# Patient Record
Sex: Male | Born: 1935
Health system: Southern US, Community
[De-identification: ages and names within clinical notes are randomized; demographics above are authoritative.]

## PROBLEM LIST (undated history)

## (undated) DIAGNOSIS — I214 Non-ST elevation (NSTEMI) myocardial infarction: Secondary | ICD-10-CM

## (undated) DIAGNOSIS — I429 Cardiomyopathy, unspecified: Secondary | ICD-10-CM

## (undated) DIAGNOSIS — C61 Malignant neoplasm of prostate: Secondary | ICD-10-CM

## (undated) DIAGNOSIS — I350 Nonrheumatic aortic (valve) stenosis: Secondary | ICD-10-CM

## (undated) DIAGNOSIS — I313 Pericardial effusion (noninflammatory): Secondary | ICD-10-CM

## (undated) DIAGNOSIS — H9190 Unspecified hearing loss, unspecified ear: Secondary | ICD-10-CM

## (undated) DIAGNOSIS — I3139 Other pericardial effusion (noninflammatory): Secondary | ICD-10-CM

---

## 2007-05-09 ENCOUNTER — Ambulatory Visit: Admission: RE | Admit: 2007-05-09 | Discharge: 2007-08-01 | Payer: Self-pay | Admitting: Radiation Oncology

## 2008-03-20 ENCOUNTER — Ambulatory Visit: Payer: Self-pay | Admitting: Cardiology

## 2011-11-25 DIAGNOSIS — E119 Type 2 diabetes mellitus without complications: Secondary | ICD-10-CM | POA: Diagnosis not present

## 2011-11-25 DIAGNOSIS — E1149 Type 2 diabetes mellitus with other diabetic neurological complication: Secondary | ICD-10-CM | POA: Diagnosis not present

## 2011-12-01 DIAGNOSIS — K3184 Gastroparesis: Secondary | ICD-10-CM | POA: Diagnosis not present

## 2011-12-01 DIAGNOSIS — R109 Unspecified abdominal pain: Secondary | ICD-10-CM | POA: Diagnosis not present

## 2011-12-09 DIAGNOSIS — N400 Enlarged prostate without lower urinary tract symptoms: Secondary | ICD-10-CM | POA: Diagnosis not present

## 2011-12-09 DIAGNOSIS — E782 Mixed hyperlipidemia: Secondary | ICD-10-CM | POA: Diagnosis not present

## 2011-12-09 DIAGNOSIS — E119 Type 2 diabetes mellitus without complications: Secondary | ICD-10-CM | POA: Diagnosis not present

## 2011-12-16 DIAGNOSIS — F411 Generalized anxiety disorder: Secondary | ICD-10-CM | POA: Diagnosis not present

## 2011-12-16 DIAGNOSIS — G252 Other specified forms of tremor: Secondary | ICD-10-CM | POA: Diagnosis not present

## 2011-12-16 DIAGNOSIS — E119 Type 2 diabetes mellitus without complications: Secondary | ICD-10-CM | POA: Diagnosis not present

## 2011-12-16 DIAGNOSIS — R1013 Epigastric pain: Secondary | ICD-10-CM | POA: Diagnosis not present

## 2011-12-16 DIAGNOSIS — M1990 Unspecified osteoarthritis, unspecified site: Secondary | ICD-10-CM | POA: Diagnosis not present

## 2012-02-03 DIAGNOSIS — E1149 Type 2 diabetes mellitus with other diabetic neurological complication: Secondary | ICD-10-CM | POA: Diagnosis not present

## 2012-02-03 DIAGNOSIS — E119 Type 2 diabetes mellitus without complications: Secondary | ICD-10-CM | POA: Diagnosis not present

## 2012-02-09 DIAGNOSIS — H6120 Impacted cerumen, unspecified ear: Secondary | ICD-10-CM | POA: Diagnosis not present

## 2012-02-09 DIAGNOSIS — H905 Unspecified sensorineural hearing loss: Secondary | ICD-10-CM | POA: Diagnosis not present

## 2012-02-09 DIAGNOSIS — H9110 Presbycusis, unspecified ear: Secondary | ICD-10-CM | POA: Diagnosis not present

## 2012-02-18 DIAGNOSIS — E782 Mixed hyperlipidemia: Secondary | ICD-10-CM | POA: Diagnosis not present

## 2012-02-18 DIAGNOSIS — Z79899 Other long term (current) drug therapy: Secondary | ICD-10-CM | POA: Diagnosis not present

## 2012-02-18 DIAGNOSIS — E119 Type 2 diabetes mellitus without complications: Secondary | ICD-10-CM | POA: Diagnosis not present

## 2012-02-23 DIAGNOSIS — G250 Essential tremor: Secondary | ICD-10-CM | POA: Diagnosis not present

## 2012-02-23 DIAGNOSIS — E119 Type 2 diabetes mellitus without complications: Secondary | ICD-10-CM | POA: Diagnosis not present

## 2012-02-23 DIAGNOSIS — M1990 Unspecified osteoarthritis, unspecified site: Secondary | ICD-10-CM | POA: Diagnosis not present

## 2012-02-23 DIAGNOSIS — R1013 Epigastric pain: Secondary | ICD-10-CM | POA: Diagnosis not present

## 2012-02-23 DIAGNOSIS — R5381 Other malaise: Secondary | ICD-10-CM | POA: Diagnosis not present

## 2012-02-23 DIAGNOSIS — F411 Generalized anxiety disorder: Secondary | ICD-10-CM | POA: Diagnosis not present

## 2012-02-23 DIAGNOSIS — G252 Other specified forms of tremor: Secondary | ICD-10-CM | POA: Diagnosis not present

## 2012-04-13 DIAGNOSIS — E1149 Type 2 diabetes mellitus with other diabetic neurological complication: Secondary | ICD-10-CM | POA: Diagnosis not present

## 2012-04-13 DIAGNOSIS — E119 Type 2 diabetes mellitus without complications: Secondary | ICD-10-CM | POA: Diagnosis not present

## 2012-05-06 DIAGNOSIS — H9209 Otalgia, unspecified ear: Secondary | ICD-10-CM | POA: Diagnosis not present

## 2012-06-15 DIAGNOSIS — C61 Malignant neoplasm of prostate: Secondary | ICD-10-CM | POA: Diagnosis not present

## 2012-06-16 DIAGNOSIS — E119 Type 2 diabetes mellitus without complications: Secondary | ICD-10-CM | POA: Diagnosis not present

## 2012-06-16 DIAGNOSIS — E782 Mixed hyperlipidemia: Secondary | ICD-10-CM | POA: Diagnosis not present

## 2012-06-24 DIAGNOSIS — G252 Other specified forms of tremor: Secondary | ICD-10-CM | POA: Diagnosis not present

## 2012-06-24 DIAGNOSIS — G250 Essential tremor: Secondary | ICD-10-CM | POA: Diagnosis not present

## 2012-06-24 DIAGNOSIS — R1013 Epigastric pain: Secondary | ICD-10-CM | POA: Diagnosis not present

## 2012-06-24 DIAGNOSIS — E119 Type 2 diabetes mellitus without complications: Secondary | ICD-10-CM | POA: Diagnosis not present

## 2012-06-24 DIAGNOSIS — M1990 Unspecified osteoarthritis, unspecified site: Secondary | ICD-10-CM | POA: Diagnosis not present

## 2012-06-24 DIAGNOSIS — K219 Gastro-esophageal reflux disease without esophagitis: Secondary | ICD-10-CM | POA: Diagnosis not present

## 2012-06-24 DIAGNOSIS — R5383 Other fatigue: Secondary | ICD-10-CM | POA: Diagnosis not present

## 2012-06-24 DIAGNOSIS — F411 Generalized anxiety disorder: Secondary | ICD-10-CM | POA: Diagnosis not present

## 2012-06-24 DIAGNOSIS — N400 Enlarged prostate without lower urinary tract symptoms: Secondary | ICD-10-CM | POA: Diagnosis not present

## 2012-06-29 DIAGNOSIS — E1149 Type 2 diabetes mellitus with other diabetic neurological complication: Secondary | ICD-10-CM | POA: Diagnosis not present

## 2012-06-29 DIAGNOSIS — E119 Type 2 diabetes mellitus without complications: Secondary | ICD-10-CM | POA: Diagnosis not present

## 2012-07-13 DIAGNOSIS — C61 Malignant neoplasm of prostate: Secondary | ICD-10-CM | POA: Diagnosis not present

## 2012-07-26 DIAGNOSIS — J069 Acute upper respiratory infection, unspecified: Secondary | ICD-10-CM | POA: Diagnosis not present

## 2012-07-26 DIAGNOSIS — J029 Acute pharyngitis, unspecified: Secondary | ICD-10-CM | POA: Diagnosis not present

## 2012-07-28 DIAGNOSIS — Z23 Encounter for immunization: Secondary | ICD-10-CM | POA: Diagnosis not present

## 2012-08-04 DIAGNOSIS — C61 Malignant neoplasm of prostate: Secondary | ICD-10-CM | POA: Diagnosis not present

## 2012-08-18 DIAGNOSIS — H402290 Chronic angle-closure glaucoma, unspecified eye, stage unspecified: Secondary | ICD-10-CM | POA: Diagnosis not present

## 2012-08-18 DIAGNOSIS — R350 Frequency of micturition: Secondary | ICD-10-CM | POA: Diagnosis not present

## 2012-08-25 DIAGNOSIS — H6120 Impacted cerumen, unspecified ear: Secondary | ICD-10-CM | POA: Diagnosis not present

## 2012-08-25 DIAGNOSIS — J069 Acute upper respiratory infection, unspecified: Secondary | ICD-10-CM | POA: Diagnosis not present

## 2012-08-25 DIAGNOSIS — H905 Unspecified sensorineural hearing loss: Secondary | ICD-10-CM | POA: Diagnosis not present

## 2012-09-07 DIAGNOSIS — E1149 Type 2 diabetes mellitus with other diabetic neurological complication: Secondary | ICD-10-CM | POA: Diagnosis not present

## 2012-09-07 DIAGNOSIS — E119 Type 2 diabetes mellitus without complications: Secondary | ICD-10-CM | POA: Diagnosis not present

## 2012-09-08 DIAGNOSIS — R1013 Epigastric pain: Secondary | ICD-10-CM | POA: Diagnosis not present

## 2012-09-08 DIAGNOSIS — R5383 Other fatigue: Secondary | ICD-10-CM | POA: Diagnosis not present

## 2012-09-08 DIAGNOSIS — K219 Gastro-esophageal reflux disease without esophagitis: Secondary | ICD-10-CM | POA: Diagnosis not present

## 2012-09-08 DIAGNOSIS — R634 Abnormal weight loss: Secondary | ICD-10-CM | POA: Diagnosis not present

## 2012-09-08 DIAGNOSIS — E119 Type 2 diabetes mellitus without complications: Secondary | ICD-10-CM | POA: Diagnosis not present

## 2012-09-08 DIAGNOSIS — M1990 Unspecified osteoarthritis, unspecified site: Secondary | ICD-10-CM | POA: Diagnosis not present

## 2012-09-08 DIAGNOSIS — F411 Generalized anxiety disorder: Secondary | ICD-10-CM | POA: Diagnosis not present

## 2012-09-08 DIAGNOSIS — G250 Essential tremor: Secondary | ICD-10-CM | POA: Diagnosis not present

## 2012-09-08 DIAGNOSIS — R5381 Other malaise: Secondary | ICD-10-CM | POA: Diagnosis not present

## 2012-10-03 DIAGNOSIS — R3915 Urgency of urination: Secondary | ICD-10-CM | POA: Diagnosis not present

## 2012-10-17 DIAGNOSIS — R5383 Other fatigue: Secondary | ICD-10-CM | POA: Diagnosis not present

## 2012-10-17 DIAGNOSIS — K219 Gastro-esophageal reflux disease without esophagitis: Secondary | ICD-10-CM | POA: Diagnosis not present

## 2012-10-17 DIAGNOSIS — N400 Enlarged prostate without lower urinary tract symptoms: Secondary | ICD-10-CM | POA: Diagnosis not present

## 2012-10-17 DIAGNOSIS — G250 Essential tremor: Secondary | ICD-10-CM | POA: Diagnosis not present

## 2012-10-17 DIAGNOSIS — E7800 Pure hypercholesterolemia, unspecified: Secondary | ICD-10-CM | POA: Diagnosis not present

## 2012-10-17 DIAGNOSIS — E119 Type 2 diabetes mellitus without complications: Secondary | ICD-10-CM | POA: Diagnosis not present

## 2012-10-17 DIAGNOSIS — Z79899 Other long term (current) drug therapy: Secondary | ICD-10-CM | POA: Diagnosis not present

## 2012-10-17 DIAGNOSIS — R5381 Other malaise: Secondary | ICD-10-CM | POA: Diagnosis not present

## 2012-10-17 DIAGNOSIS — F411 Generalized anxiety disorder: Secondary | ICD-10-CM | POA: Diagnosis not present

## 2012-10-17 DIAGNOSIS — M1990 Unspecified osteoarthritis, unspecified site: Secondary | ICD-10-CM | POA: Diagnosis not present

## 2012-10-17 DIAGNOSIS — R1013 Epigastric pain: Secondary | ICD-10-CM | POA: Diagnosis not present

## 2012-10-17 DIAGNOSIS — G252 Other specified forms of tremor: Secondary | ICD-10-CM | POA: Diagnosis not present

## 2012-10-19 DIAGNOSIS — R3915 Urgency of urination: Secondary | ICD-10-CM | POA: Diagnosis not present

## 2012-10-26 DIAGNOSIS — R351 Nocturia: Secondary | ICD-10-CM | POA: Diagnosis not present

## 2012-10-26 DIAGNOSIS — K5900 Constipation, unspecified: Secondary | ICD-10-CM | POA: Diagnosis not present

## 2012-10-26 DIAGNOSIS — N3943 Post-void dribbling: Secondary | ICD-10-CM | POA: Diagnosis not present

## 2012-10-26 DIAGNOSIS — R3915 Urgency of urination: Secondary | ICD-10-CM | POA: Diagnosis not present

## 2012-11-23 DIAGNOSIS — E119 Type 2 diabetes mellitus without complications: Secondary | ICD-10-CM | POA: Diagnosis not present

## 2012-11-23 DIAGNOSIS — E1149 Type 2 diabetes mellitus with other diabetic neurological complication: Secondary | ICD-10-CM | POA: Diagnosis not present

## 2012-11-25 DIAGNOSIS — R3915 Urgency of urination: Secondary | ICD-10-CM | POA: Diagnosis not present

## 2012-11-25 DIAGNOSIS — K5900 Constipation, unspecified: Secondary | ICD-10-CM | POA: Diagnosis not present

## 2012-11-25 DIAGNOSIS — R351 Nocturia: Secondary | ICD-10-CM | POA: Diagnosis not present

## 2012-11-25 DIAGNOSIS — N3943 Post-void dribbling: Secondary | ICD-10-CM | POA: Diagnosis not present

## 2012-12-22 DIAGNOSIS — E538 Deficiency of other specified B group vitamins: Secondary | ICD-10-CM | POA: Diagnosis not present

## 2012-12-22 DIAGNOSIS — R209 Unspecified disturbances of skin sensation: Secondary | ICD-10-CM | POA: Diagnosis not present

## 2012-12-22 DIAGNOSIS — E119 Type 2 diabetes mellitus without complications: Secondary | ICD-10-CM | POA: Diagnosis not present

## 2013-01-31 DIAGNOSIS — E119 Type 2 diabetes mellitus without complications: Secondary | ICD-10-CM | POA: Diagnosis not present

## 2013-01-31 DIAGNOSIS — E782 Mixed hyperlipidemia: Secondary | ICD-10-CM | POA: Diagnosis not present

## 2013-02-01 DIAGNOSIS — E119 Type 2 diabetes mellitus without complications: Secondary | ICD-10-CM | POA: Diagnosis not present

## 2013-02-01 DIAGNOSIS — E1149 Type 2 diabetes mellitus with other diabetic neurological complication: Secondary | ICD-10-CM | POA: Diagnosis not present

## 2013-02-07 DIAGNOSIS — N400 Enlarged prostate without lower urinary tract symptoms: Secondary | ICD-10-CM | POA: Diagnosis not present

## 2013-02-07 DIAGNOSIS — R209 Unspecified disturbances of skin sensation: Secondary | ICD-10-CM | POA: Diagnosis not present

## 2013-02-07 DIAGNOSIS — E119 Type 2 diabetes mellitus without complications: Secondary | ICD-10-CM | POA: Diagnosis not present

## 2013-02-07 DIAGNOSIS — M1990 Unspecified osteoarthritis, unspecified site: Secondary | ICD-10-CM | POA: Diagnosis not present

## 2013-02-07 DIAGNOSIS — E782 Mixed hyperlipidemia: Secondary | ICD-10-CM | POA: Diagnosis not present

## 2013-02-21 DIAGNOSIS — J309 Allergic rhinitis, unspecified: Secondary | ICD-10-CM | POA: Diagnosis not present

## 2013-02-21 DIAGNOSIS — R209 Unspecified disturbances of skin sensation: Secondary | ICD-10-CM | POA: Diagnosis not present

## 2013-03-02 DIAGNOSIS — R5381 Other malaise: Secondary | ICD-10-CM | POA: Diagnosis not present

## 2013-03-02 DIAGNOSIS — K219 Gastro-esophageal reflux disease without esophagitis: Secondary | ICD-10-CM | POA: Diagnosis not present

## 2013-03-02 DIAGNOSIS — R209 Unspecified disturbances of skin sensation: Secondary | ICD-10-CM | POA: Diagnosis not present

## 2013-03-06 DIAGNOSIS — M1990 Unspecified osteoarthritis, unspecified site: Secondary | ICD-10-CM | POA: Diagnosis not present

## 2013-03-06 DIAGNOSIS — R209 Unspecified disturbances of skin sensation: Secondary | ICD-10-CM | POA: Diagnosis not present

## 2013-03-06 DIAGNOSIS — E119 Type 2 diabetes mellitus without complications: Secondary | ICD-10-CM | POA: Diagnosis not present

## 2013-03-06 DIAGNOSIS — N400 Enlarged prostate without lower urinary tract symptoms: Secondary | ICD-10-CM | POA: Diagnosis not present

## 2013-03-06 DIAGNOSIS — E782 Mixed hyperlipidemia: Secondary | ICD-10-CM | POA: Diagnosis not present

## 2013-03-13 DIAGNOSIS — H9319 Tinnitus, unspecified ear: Secondary | ICD-10-CM | POA: Diagnosis not present

## 2013-03-13 DIAGNOSIS — H6120 Impacted cerumen, unspecified ear: Secondary | ICD-10-CM | POA: Diagnosis not present

## 2013-03-24 DIAGNOSIS — C61 Malignant neoplasm of prostate: Secondary | ICD-10-CM | POA: Diagnosis not present

## 2013-03-24 DIAGNOSIS — K5900 Constipation, unspecified: Secondary | ICD-10-CM | POA: Diagnosis not present

## 2013-03-24 DIAGNOSIS — R3915 Urgency of urination: Secondary | ICD-10-CM | POA: Diagnosis not present

## 2013-03-24 DIAGNOSIS — N35919 Unspecified urethral stricture, male, unspecified site: Secondary | ICD-10-CM | POA: Diagnosis not present

## 2013-04-12 DIAGNOSIS — E119 Type 2 diabetes mellitus without complications: Secondary | ICD-10-CM | POA: Diagnosis not present

## 2013-04-12 DIAGNOSIS — E1149 Type 2 diabetes mellitus with other diabetic neurological complication: Secondary | ICD-10-CM | POA: Diagnosis not present

## 2013-04-26 DIAGNOSIS — H4011X0 Primary open-angle glaucoma, stage unspecified: Secondary | ICD-10-CM | POA: Diagnosis not present

## 2013-05-15 DIAGNOSIS — I10 Essential (primary) hypertension: Secondary | ICD-10-CM | POA: Diagnosis not present

## 2013-05-15 DIAGNOSIS — N400 Enlarged prostate without lower urinary tract symptoms: Secondary | ICD-10-CM | POA: Diagnosis not present

## 2013-05-15 DIAGNOSIS — E119 Type 2 diabetes mellitus without complications: Secondary | ICD-10-CM | POA: Diagnosis not present

## 2013-05-15 DIAGNOSIS — R5383 Other fatigue: Secondary | ICD-10-CM | POA: Diagnosis not present

## 2013-05-22 DIAGNOSIS — E119 Type 2 diabetes mellitus without complications: Secondary | ICD-10-CM | POA: Diagnosis not present

## 2013-05-22 DIAGNOSIS — R209 Unspecified disturbances of skin sensation: Secondary | ICD-10-CM | POA: Diagnosis not present

## 2013-05-22 DIAGNOSIS — E782 Mixed hyperlipidemia: Secondary | ICD-10-CM | POA: Diagnosis not present

## 2013-05-22 DIAGNOSIS — M1990 Unspecified osteoarthritis, unspecified site: Secondary | ICD-10-CM | POA: Diagnosis not present

## 2013-05-22 DIAGNOSIS — N400 Enlarged prostate without lower urinary tract symptoms: Secondary | ICD-10-CM | POA: Diagnosis not present

## 2013-06-21 DIAGNOSIS — E119 Type 2 diabetes mellitus without complications: Secondary | ICD-10-CM | POA: Diagnosis not present

## 2013-06-21 DIAGNOSIS — E1149 Type 2 diabetes mellitus with other diabetic neurological complication: Secondary | ICD-10-CM | POA: Diagnosis not present

## 2013-07-27 DIAGNOSIS — Z23 Encounter for immunization: Secondary | ICD-10-CM | POA: Diagnosis not present

## 2013-08-30 DIAGNOSIS — E119 Type 2 diabetes mellitus without complications: Secondary | ICD-10-CM | POA: Diagnosis not present

## 2013-08-30 DIAGNOSIS — E1149 Type 2 diabetes mellitus with other diabetic neurological complication: Secondary | ICD-10-CM | POA: Diagnosis not present

## 2013-09-13 DIAGNOSIS — I10 Essential (primary) hypertension: Secondary | ICD-10-CM | POA: Diagnosis not present

## 2013-09-13 DIAGNOSIS — E782 Mixed hyperlipidemia: Secondary | ICD-10-CM | POA: Diagnosis not present

## 2013-09-15 DIAGNOSIS — H905 Unspecified sensorineural hearing loss: Secondary | ICD-10-CM | POA: Diagnosis not present

## 2013-09-15 DIAGNOSIS — G4700 Insomnia, unspecified: Secondary | ICD-10-CM | POA: Diagnosis not present

## 2013-09-15 DIAGNOSIS — H6120 Impacted cerumen, unspecified ear: Secondary | ICD-10-CM | POA: Diagnosis not present

## 2013-09-15 DIAGNOSIS — H9319 Tinnitus, unspecified ear: Secondary | ICD-10-CM | POA: Diagnosis not present

## 2013-09-20 DIAGNOSIS — E782 Mixed hyperlipidemia: Secondary | ICD-10-CM | POA: Diagnosis not present

## 2013-09-20 DIAGNOSIS — R209 Unspecified disturbances of skin sensation: Secondary | ICD-10-CM | POA: Diagnosis not present

## 2013-09-20 DIAGNOSIS — E1149 Type 2 diabetes mellitus with other diabetic neurological complication: Secondary | ICD-10-CM | POA: Diagnosis not present

## 2013-09-20 DIAGNOSIS — M1990 Unspecified osteoarthritis, unspecified site: Secondary | ICD-10-CM | POA: Diagnosis not present

## 2013-09-20 DIAGNOSIS — N400 Enlarged prostate without lower urinary tract symptoms: Secondary | ICD-10-CM | POA: Diagnosis not present

## 2013-09-20 DIAGNOSIS — Z1331 Encounter for screening for depression: Secondary | ICD-10-CM | POA: Diagnosis not present

## 2013-09-20 DIAGNOSIS — E119 Type 2 diabetes mellitus without complications: Secondary | ICD-10-CM | POA: Diagnosis not present

## 2013-09-25 DIAGNOSIS — N35919 Unspecified urethral stricture, male, unspecified site: Secondary | ICD-10-CM | POA: Diagnosis not present

## 2013-09-25 DIAGNOSIS — R3915 Urgency of urination: Secondary | ICD-10-CM | POA: Diagnosis not present

## 2013-09-25 DIAGNOSIS — C61 Malignant neoplasm of prostate: Secondary | ICD-10-CM | POA: Diagnosis not present

## 2013-09-25 DIAGNOSIS — K5900 Constipation, unspecified: Secondary | ICD-10-CM | POA: Diagnosis not present

## 2013-10-24 DIAGNOSIS — R3915 Urgency of urination: Secondary | ICD-10-CM | POA: Diagnosis not present

## 2013-10-24 DIAGNOSIS — C61 Malignant neoplasm of prostate: Secondary | ICD-10-CM | POA: Diagnosis not present

## 2013-10-24 DIAGNOSIS — N35919 Unspecified urethral stricture, male, unspecified site: Secondary | ICD-10-CM | POA: Diagnosis not present

## 2013-10-24 DIAGNOSIS — K5900 Constipation, unspecified: Secondary | ICD-10-CM | POA: Diagnosis not present

## 2013-11-01 DIAGNOSIS — E1149 Type 2 diabetes mellitus with other diabetic neurological complication: Secondary | ICD-10-CM | POA: Diagnosis not present

## 2013-11-01 DIAGNOSIS — E119 Type 2 diabetes mellitus without complications: Secondary | ICD-10-CM | POA: Diagnosis not present

## 2013-11-13 DIAGNOSIS — H60399 Other infective otitis externa, unspecified ear: Secondary | ICD-10-CM | POA: Diagnosis not present

## 2013-11-13 DIAGNOSIS — H905 Unspecified sensorineural hearing loss: Secondary | ICD-10-CM | POA: Diagnosis not present

## 2013-11-24 DIAGNOSIS — R3915 Urgency of urination: Secondary | ICD-10-CM | POA: Diagnosis not present

## 2013-11-24 DIAGNOSIS — K5900 Constipation, unspecified: Secondary | ICD-10-CM | POA: Diagnosis not present

## 2013-11-24 DIAGNOSIS — C61 Malignant neoplasm of prostate: Secondary | ICD-10-CM | POA: Diagnosis not present

## 2013-12-11 DIAGNOSIS — E119 Type 2 diabetes mellitus without complications: Secondary | ICD-10-CM | POA: Diagnosis not present

## 2013-12-25 DIAGNOSIS — H6120 Impacted cerumen, unspecified ear: Secondary | ICD-10-CM | POA: Diagnosis not present

## 2013-12-27 DIAGNOSIS — R5383 Other fatigue: Secondary | ICD-10-CM | POA: Diagnosis not present

## 2013-12-27 DIAGNOSIS — M1990 Unspecified osteoarthritis, unspecified site: Secondary | ICD-10-CM | POA: Diagnosis not present

## 2013-12-27 DIAGNOSIS — R5381 Other malaise: Secondary | ICD-10-CM | POA: Diagnosis not present

## 2013-12-27 DIAGNOSIS — E782 Mixed hyperlipidemia: Secondary | ICD-10-CM | POA: Diagnosis not present

## 2013-12-27 DIAGNOSIS — E119 Type 2 diabetes mellitus without complications: Secondary | ICD-10-CM | POA: Diagnosis not present

## 2013-12-27 DIAGNOSIS — K2100 Gastro-esophageal reflux disease with esophagitis, without bleeding: Secondary | ICD-10-CM | POA: Diagnosis not present

## 2013-12-27 DIAGNOSIS — I10 Essential (primary) hypertension: Secondary | ICD-10-CM | POA: Diagnosis not present

## 2014-01-03 DIAGNOSIS — K2100 Gastro-esophageal reflux disease with esophagitis, without bleeding: Secondary | ICD-10-CM | POA: Diagnosis not present

## 2014-01-03 DIAGNOSIS — M1990 Unspecified osteoarthritis, unspecified site: Secondary | ICD-10-CM | POA: Diagnosis not present

## 2014-01-03 DIAGNOSIS — E782 Mixed hyperlipidemia: Secondary | ICD-10-CM | POA: Diagnosis not present

## 2014-01-03 DIAGNOSIS — E1149 Type 2 diabetes mellitus with other diabetic neurological complication: Secondary | ICD-10-CM | POA: Diagnosis not present

## 2014-01-03 DIAGNOSIS — R209 Unspecified disturbances of skin sensation: Secondary | ICD-10-CM | POA: Diagnosis not present

## 2014-01-03 DIAGNOSIS — N400 Enlarged prostate without lower urinary tract symptoms: Secondary | ICD-10-CM | POA: Diagnosis not present

## 2014-01-03 DIAGNOSIS — Z1331 Encounter for screening for depression: Secondary | ICD-10-CM | POA: Diagnosis not present

## 2014-01-03 DIAGNOSIS — E119 Type 2 diabetes mellitus without complications: Secondary | ICD-10-CM | POA: Diagnosis not present

## 2014-01-17 DIAGNOSIS — E1149 Type 2 diabetes mellitus with other diabetic neurological complication: Secondary | ICD-10-CM | POA: Diagnosis not present

## 2014-01-17 DIAGNOSIS — E119 Type 2 diabetes mellitus without complications: Secondary | ICD-10-CM | POA: Diagnosis not present

## 2014-03-21 DIAGNOSIS — H905 Unspecified sensorineural hearing loss: Secondary | ICD-10-CM | POA: Diagnosis not present

## 2014-03-21 DIAGNOSIS — H6120 Impacted cerumen, unspecified ear: Secondary | ICD-10-CM | POA: Diagnosis not present

## 2014-03-21 DIAGNOSIS — H9209 Otalgia, unspecified ear: Secondary | ICD-10-CM | POA: Diagnosis not present

## 2014-03-28 DIAGNOSIS — E1149 Type 2 diabetes mellitus with other diabetic neurological complication: Secondary | ICD-10-CM | POA: Diagnosis not present

## 2014-03-28 DIAGNOSIS — E119 Type 2 diabetes mellitus without complications: Secondary | ICD-10-CM | POA: Diagnosis not present

## 2014-05-16 DIAGNOSIS — E782 Mixed hyperlipidemia: Secondary | ICD-10-CM | POA: Diagnosis not present

## 2014-05-16 DIAGNOSIS — I10 Essential (primary) hypertension: Secondary | ICD-10-CM | POA: Diagnosis not present

## 2014-05-16 DIAGNOSIS — R5381 Other malaise: Secondary | ICD-10-CM | POA: Diagnosis not present

## 2014-05-16 DIAGNOSIS — IMO0001 Reserved for inherently not codable concepts without codable children: Secondary | ICD-10-CM | POA: Diagnosis not present

## 2014-05-16 DIAGNOSIS — K219 Gastro-esophageal reflux disease without esophagitis: Secondary | ICD-10-CM | POA: Diagnosis not present

## 2014-05-23 DIAGNOSIS — M1990 Unspecified osteoarthritis, unspecified site: Secondary | ICD-10-CM | POA: Diagnosis not present

## 2014-05-23 DIAGNOSIS — N400 Enlarged prostate without lower urinary tract symptoms: Secondary | ICD-10-CM | POA: Diagnosis not present

## 2014-05-23 DIAGNOSIS — E782 Mixed hyperlipidemia: Secondary | ICD-10-CM | POA: Diagnosis not present

## 2014-05-23 DIAGNOSIS — K2100 Gastro-esophageal reflux disease with esophagitis, without bleeding: Secondary | ICD-10-CM | POA: Diagnosis not present

## 2014-05-23 DIAGNOSIS — E1149 Type 2 diabetes mellitus with other diabetic neurological complication: Secondary | ICD-10-CM | POA: Diagnosis not present

## 2014-05-23 DIAGNOSIS — E119 Type 2 diabetes mellitus without complications: Secondary | ICD-10-CM | POA: Diagnosis not present

## 2014-05-23 DIAGNOSIS — R209 Unspecified disturbances of skin sensation: Secondary | ICD-10-CM | POA: Diagnosis not present

## 2014-06-07 DIAGNOSIS — H9209 Otalgia, unspecified ear: Secondary | ICD-10-CM | POA: Diagnosis not present

## 2014-06-07 DIAGNOSIS — M542 Cervicalgia: Secondary | ICD-10-CM | POA: Diagnosis not present

## 2014-06-07 DIAGNOSIS — J310 Chronic rhinitis: Secondary | ICD-10-CM | POA: Diagnosis not present

## 2014-06-11 DIAGNOSIS — M542 Cervicalgia: Secondary | ICD-10-CM | POA: Diagnosis not present

## 2014-06-11 DIAGNOSIS — J019 Acute sinusitis, unspecified: Secondary | ICD-10-CM | POA: Diagnosis not present

## 2014-06-13 DIAGNOSIS — E119 Type 2 diabetes mellitus without complications: Secondary | ICD-10-CM | POA: Diagnosis not present

## 2014-06-13 DIAGNOSIS — E1149 Type 2 diabetes mellitus with other diabetic neurological complication: Secondary | ICD-10-CM | POA: Diagnosis not present

## 2014-06-18 DIAGNOSIS — H4011X0 Primary open-angle glaucoma, stage unspecified: Secondary | ICD-10-CM | POA: Diagnosis not present

## 2014-06-18 DIAGNOSIS — K5900 Constipation, unspecified: Secondary | ICD-10-CM | POA: Diagnosis not present

## 2014-06-18 DIAGNOSIS — R3915 Urgency of urination: Secondary | ICD-10-CM | POA: Diagnosis not present

## 2014-07-04 DIAGNOSIS — C61 Malignant neoplasm of prostate: Secondary | ICD-10-CM | POA: Diagnosis not present

## 2014-07-04 DIAGNOSIS — R972 Elevated prostate specific antigen [PSA]: Secondary | ICD-10-CM | POA: Diagnosis not present

## 2014-07-18 DIAGNOSIS — E1149 Type 2 diabetes mellitus with other diabetic neurological complication: Secondary | ICD-10-CM | POA: Diagnosis not present

## 2014-07-18 DIAGNOSIS — M1990 Unspecified osteoarthritis, unspecified site: Secondary | ICD-10-CM | POA: Diagnosis not present

## 2014-07-18 DIAGNOSIS — N400 Enlarged prostate without lower urinary tract symptoms: Secondary | ICD-10-CM | POA: Diagnosis not present

## 2014-07-18 DIAGNOSIS — E782 Mixed hyperlipidemia: Secondary | ICD-10-CM | POA: Diagnosis not present

## 2014-07-18 DIAGNOSIS — E119 Type 2 diabetes mellitus without complications: Secondary | ICD-10-CM | POA: Diagnosis not present

## 2014-07-18 DIAGNOSIS — Z125 Encounter for screening for malignant neoplasm of prostate: Secondary | ICD-10-CM | POA: Diagnosis not present

## 2014-07-18 DIAGNOSIS — R209 Unspecified disturbances of skin sensation: Secondary | ICD-10-CM | POA: Diagnosis not present

## 2014-07-18 DIAGNOSIS — K2100 Gastro-esophageal reflux disease with esophagitis, without bleeding: Secondary | ICD-10-CM | POA: Diagnosis not present

## 2014-08-06 DIAGNOSIS — Z23 Encounter for immunization: Secondary | ICD-10-CM | POA: Diagnosis not present

## 2014-08-21 DIAGNOSIS — E1142 Type 2 diabetes mellitus with diabetic polyneuropathy: Secondary | ICD-10-CM | POA: Diagnosis not present

## 2014-08-21 DIAGNOSIS — E1140 Type 2 diabetes mellitus with diabetic neuropathy, unspecified: Secondary | ICD-10-CM | POA: Diagnosis not present

## 2014-08-31 DIAGNOSIS — H903 Sensorineural hearing loss, bilateral: Secondary | ICD-10-CM | POA: Diagnosis not present

## 2014-08-31 DIAGNOSIS — R29818 Other symptoms and signs involving the nervous system: Secondary | ICD-10-CM | POA: Diagnosis not present

## 2014-08-31 DIAGNOSIS — R2689 Other abnormalities of gait and mobility: Secondary | ICD-10-CM | POA: Diagnosis not present

## 2014-08-31 DIAGNOSIS — E119 Type 2 diabetes mellitus without complications: Secondary | ICD-10-CM | POA: Diagnosis not present

## 2014-08-31 DIAGNOSIS — H6123 Impacted cerumen, bilateral: Secondary | ICD-10-CM | POA: Diagnosis not present

## 2014-09-12 DIAGNOSIS — E1165 Type 2 diabetes mellitus with hyperglycemia: Secondary | ICD-10-CM | POA: Diagnosis not present

## 2014-09-12 DIAGNOSIS — R5383 Other fatigue: Secondary | ICD-10-CM | POA: Diagnosis not present

## 2014-09-12 DIAGNOSIS — E782 Mixed hyperlipidemia: Secondary | ICD-10-CM | POA: Diagnosis not present

## 2014-09-12 DIAGNOSIS — I10 Essential (primary) hypertension: Secondary | ICD-10-CM | POA: Diagnosis not present

## 2014-09-19 DIAGNOSIS — H402231 Chronic angle-closure glaucoma, bilateral, mild stage: Secondary | ICD-10-CM | POA: Diagnosis not present

## 2014-09-19 DIAGNOSIS — Z9189 Other specified personal risk factors, not elsewhere classified: Secondary | ICD-10-CM | POA: Diagnosis not present

## 2014-09-19 DIAGNOSIS — C61 Malignant neoplasm of prostate: Secondary | ICD-10-CM | POA: Diagnosis not present

## 2014-09-19 DIAGNOSIS — F411 Generalized anxiety disorder: Secondary | ICD-10-CM | POA: Diagnosis not present

## 2014-09-19 DIAGNOSIS — E782 Mixed hyperlipidemia: Secondary | ICD-10-CM | POA: Diagnosis not present

## 2014-09-19 DIAGNOSIS — E1165 Type 2 diabetes mellitus with hyperglycemia: Secondary | ICD-10-CM | POA: Diagnosis not present

## 2014-09-19 DIAGNOSIS — I10 Essential (primary) hypertension: Secondary | ICD-10-CM | POA: Diagnosis not present

## 2014-09-19 DIAGNOSIS — D519 Vitamin B12 deficiency anemia, unspecified: Secondary | ICD-10-CM | POA: Diagnosis not present

## 2014-10-30 DIAGNOSIS — E1142 Type 2 diabetes mellitus with diabetic polyneuropathy: Secondary | ICD-10-CM | POA: Diagnosis not present

## 2014-11-26 DIAGNOSIS — C61 Malignant neoplasm of prostate: Secondary | ICD-10-CM | POA: Diagnosis not present

## 2014-12-03 DIAGNOSIS — N359 Urethral stricture, unspecified: Secondary | ICD-10-CM | POA: Diagnosis not present

## 2014-12-03 DIAGNOSIS — C61 Malignant neoplasm of prostate: Secondary | ICD-10-CM | POA: Diagnosis not present

## 2014-12-27 DIAGNOSIS — H6123 Impacted cerumen, bilateral: Secondary | ICD-10-CM | POA: Diagnosis not present

## 2014-12-27 DIAGNOSIS — H9203 Otalgia, bilateral: Secondary | ICD-10-CM | POA: Diagnosis not present

## 2014-12-27 DIAGNOSIS — E119 Type 2 diabetes mellitus without complications: Secondary | ICD-10-CM | POA: Diagnosis not present

## 2014-12-27 DIAGNOSIS — H906 Mixed conductive and sensorineural hearing loss, bilateral: Secondary | ICD-10-CM | POA: Diagnosis not present

## 2015-01-01 DIAGNOSIS — N35112 Postinfective bulbous urethral stricture, not elsewhere classified: Secondary | ICD-10-CM | POA: Diagnosis not present

## 2015-01-15 DIAGNOSIS — E1142 Type 2 diabetes mellitus with diabetic polyneuropathy: Secondary | ICD-10-CM | POA: Diagnosis not present

## 2015-01-29 DIAGNOSIS — C61 Malignant neoplasm of prostate: Secondary | ICD-10-CM | POA: Diagnosis not present

## 2015-01-29 DIAGNOSIS — F411 Generalized anxiety disorder: Secondary | ICD-10-CM | POA: Diagnosis not present

## 2015-01-29 DIAGNOSIS — E1165 Type 2 diabetes mellitus with hyperglycemia: Secondary | ICD-10-CM | POA: Diagnosis not present

## 2015-01-29 DIAGNOSIS — K210 Gastro-esophageal reflux disease with esophagitis: Secondary | ICD-10-CM | POA: Diagnosis not present

## 2015-01-29 DIAGNOSIS — E782 Mixed hyperlipidemia: Secondary | ICD-10-CM | POA: Diagnosis not present

## 2015-01-29 DIAGNOSIS — I10 Essential (primary) hypertension: Secondary | ICD-10-CM | POA: Diagnosis not present

## 2015-02-05 DIAGNOSIS — H402231 Chronic angle-closure glaucoma, bilateral, mild stage: Secondary | ICD-10-CM | POA: Diagnosis not present

## 2015-02-05 DIAGNOSIS — I10 Essential (primary) hypertension: Secondary | ICD-10-CM | POA: Diagnosis not present

## 2015-02-05 DIAGNOSIS — C61 Malignant neoplasm of prostate: Secondary | ICD-10-CM | POA: Diagnosis not present

## 2015-02-05 DIAGNOSIS — E782 Mixed hyperlipidemia: Secondary | ICD-10-CM | POA: Diagnosis not present

## 2015-02-05 DIAGNOSIS — K219 Gastro-esophageal reflux disease without esophagitis: Secondary | ICD-10-CM | POA: Diagnosis not present

## 2015-02-05 DIAGNOSIS — Z0001 Encounter for general adult medical examination with abnormal findings: Secondary | ICD-10-CM | POA: Diagnosis not present

## 2015-02-05 DIAGNOSIS — Z9189 Other specified personal risk factors, not elsewhere classified: Secondary | ICD-10-CM | POA: Diagnosis not present

## 2015-02-05 DIAGNOSIS — E1165 Type 2 diabetes mellitus with hyperglycemia: Secondary | ICD-10-CM | POA: Diagnosis not present

## 2015-02-05 DIAGNOSIS — D519 Vitamin B12 deficiency anemia, unspecified: Secondary | ICD-10-CM | POA: Diagnosis not present

## 2015-02-05 DIAGNOSIS — Z1389 Encounter for screening for other disorder: Secondary | ICD-10-CM | POA: Diagnosis not present

## 2015-02-05 DIAGNOSIS — H903 Sensorineural hearing loss, bilateral: Secondary | ICD-10-CM | POA: Diagnosis not present

## 2015-02-05 DIAGNOSIS — F411 Generalized anxiety disorder: Secondary | ICD-10-CM | POA: Diagnosis not present

## 2015-02-18 DIAGNOSIS — H4011X1 Primary open-angle glaucoma, mild stage: Secondary | ICD-10-CM | POA: Diagnosis not present

## 2015-03-26 DIAGNOSIS — K648 Other hemorrhoids: Secondary | ICD-10-CM | POA: Diagnosis not present

## 2015-04-02 DIAGNOSIS — E1142 Type 2 diabetes mellitus with diabetic polyneuropathy: Secondary | ICD-10-CM | POA: Diagnosis not present

## 2015-04-08 DIAGNOSIS — J301 Allergic rhinitis due to pollen: Secondary | ICD-10-CM | POA: Diagnosis not present

## 2015-04-08 DIAGNOSIS — H9202 Otalgia, left ear: Secondary | ICD-10-CM | POA: Diagnosis not present

## 2015-04-08 DIAGNOSIS — K648 Other hemorrhoids: Secondary | ICD-10-CM | POA: Diagnosis not present

## 2015-06-06 DIAGNOSIS — K210 Gastro-esophageal reflux disease with esophagitis: Secondary | ICD-10-CM | POA: Diagnosis not present

## 2015-06-06 DIAGNOSIS — I10 Essential (primary) hypertension: Secondary | ICD-10-CM | POA: Diagnosis not present

## 2015-06-06 DIAGNOSIS — C61 Malignant neoplasm of prostate: Secondary | ICD-10-CM | POA: Diagnosis not present

## 2015-06-06 DIAGNOSIS — E1165 Type 2 diabetes mellitus with hyperglycemia: Secondary | ICD-10-CM | POA: Diagnosis not present

## 2015-06-06 DIAGNOSIS — E782 Mixed hyperlipidemia: Secondary | ICD-10-CM | POA: Diagnosis not present

## 2015-06-10 DIAGNOSIS — K040 Pulpitis: Secondary | ICD-10-CM | POA: Diagnosis not present

## 2015-06-11 DIAGNOSIS — E1142 Type 2 diabetes mellitus with diabetic polyneuropathy: Secondary | ICD-10-CM | POA: Diagnosis not present

## 2015-06-14 DIAGNOSIS — I10 Essential (primary) hypertension: Secondary | ICD-10-CM | POA: Diagnosis not present

## 2015-06-14 DIAGNOSIS — J301 Allergic rhinitis due to pollen: Secondary | ICD-10-CM | POA: Diagnosis not present

## 2015-06-14 DIAGNOSIS — K219 Gastro-esophageal reflux disease without esophagitis: Secondary | ICD-10-CM | POA: Diagnosis not present

## 2015-06-14 DIAGNOSIS — E1165 Type 2 diabetes mellitus with hyperglycemia: Secondary | ICD-10-CM | POA: Diagnosis not present

## 2015-06-14 DIAGNOSIS — C61 Malignant neoplasm of prostate: Secondary | ICD-10-CM | POA: Diagnosis not present

## 2015-06-14 DIAGNOSIS — F411 Generalized anxiety disorder: Secondary | ICD-10-CM | POA: Diagnosis not present

## 2015-06-14 DIAGNOSIS — H903 Sensorineural hearing loss, bilateral: Secondary | ICD-10-CM | POA: Diagnosis not present

## 2015-07-04 DIAGNOSIS — H906 Mixed conductive and sensorineural hearing loss, bilateral: Secondary | ICD-10-CM | POA: Diagnosis not present

## 2015-07-04 DIAGNOSIS — H6123 Impacted cerumen, bilateral: Secondary | ICD-10-CM | POA: Diagnosis not present

## 2015-08-20 DIAGNOSIS — H401131 Primary open-angle glaucoma, bilateral, mild stage: Secondary | ICD-10-CM | POA: Diagnosis not present

## 2015-08-29 DIAGNOSIS — Z23 Encounter for immunization: Secondary | ICD-10-CM | POA: Diagnosis not present

## 2015-09-03 DIAGNOSIS — E1142 Type 2 diabetes mellitus with diabetic polyneuropathy: Secondary | ICD-10-CM | POA: Diagnosis not present

## 2015-10-15 DIAGNOSIS — R5383 Other fatigue: Secondary | ICD-10-CM | POA: Diagnosis not present

## 2015-10-15 DIAGNOSIS — K210 Gastro-esophageal reflux disease with esophagitis: Secondary | ICD-10-CM | POA: Diagnosis not present

## 2015-10-15 DIAGNOSIS — E1165 Type 2 diabetes mellitus with hyperglycemia: Secondary | ICD-10-CM | POA: Diagnosis not present

## 2015-10-15 DIAGNOSIS — I10 Essential (primary) hypertension: Secondary | ICD-10-CM | POA: Diagnosis not present

## 2015-10-15 DIAGNOSIS — E782 Mixed hyperlipidemia: Secondary | ICD-10-CM | POA: Diagnosis not present

## 2015-10-22 DIAGNOSIS — H903 Sensorineural hearing loss, bilateral: Secondary | ICD-10-CM | POA: Diagnosis not present

## 2015-10-22 DIAGNOSIS — K219 Gastro-esophageal reflux disease without esophagitis: Secondary | ICD-10-CM | POA: Diagnosis not present

## 2015-10-22 DIAGNOSIS — E782 Mixed hyperlipidemia: Secondary | ICD-10-CM | POA: Diagnosis not present

## 2015-10-22 DIAGNOSIS — C61 Malignant neoplasm of prostate: Secondary | ICD-10-CM | POA: Diagnosis not present

## 2015-10-22 DIAGNOSIS — J301 Allergic rhinitis due to pollen: Secondary | ICD-10-CM | POA: Diagnosis not present

## 2015-10-22 DIAGNOSIS — I10 Essential (primary) hypertension: Secondary | ICD-10-CM | POA: Diagnosis not present

## 2015-10-22 DIAGNOSIS — H402231 Chronic angle-closure glaucoma, bilateral, mild stage: Secondary | ICD-10-CM | POA: Diagnosis not present

## 2015-10-22 DIAGNOSIS — E1165 Type 2 diabetes mellitus with hyperglycemia: Secondary | ICD-10-CM | POA: Diagnosis not present

## 2015-10-22 DIAGNOSIS — D519 Vitamin B12 deficiency anemia, unspecified: Secondary | ICD-10-CM | POA: Diagnosis not present

## 2015-10-22 DIAGNOSIS — F411 Generalized anxiety disorder: Secondary | ICD-10-CM | POA: Diagnosis not present

## 2015-11-26 DIAGNOSIS — E1142 Type 2 diabetes mellitus with diabetic polyneuropathy: Secondary | ICD-10-CM | POA: Diagnosis not present

## 2015-12-23 DIAGNOSIS — R202 Paresthesia of skin: Secondary | ICD-10-CM | POA: Diagnosis not present

## 2015-12-23 DIAGNOSIS — J029 Acute pharyngitis, unspecified: Secondary | ICD-10-CM | POA: Diagnosis not present

## 2015-12-31 DIAGNOSIS — N99113 Postprocedural anterior urethral stricture: Secondary | ICD-10-CM | POA: Diagnosis not present

## 2015-12-31 DIAGNOSIS — N35011 Post-traumatic bulbous urethral stricture: Secondary | ICD-10-CM | POA: Diagnosis not present

## 2015-12-31 DIAGNOSIS — N99115 Postprocedural fossa navicularis urethral stricture: Secondary | ICD-10-CM | POA: Diagnosis not present

## 2015-12-31 DIAGNOSIS — C61 Malignant neoplasm of prostate: Secondary | ICD-10-CM | POA: Diagnosis not present

## 2016-01-07 DIAGNOSIS — R300 Dysuria: Secondary | ICD-10-CM | POA: Diagnosis not present

## 2016-01-09 DIAGNOSIS — H903 Sensorineural hearing loss, bilateral: Secondary | ICD-10-CM | POA: Diagnosis not present

## 2016-01-09 DIAGNOSIS — H6123 Impacted cerumen, bilateral: Secondary | ICD-10-CM | POA: Diagnosis not present

## 2016-01-09 DIAGNOSIS — H9203 Otalgia, bilateral: Secondary | ICD-10-CM | POA: Diagnosis not present

## 2016-01-09 DIAGNOSIS — Z881 Allergy status to other antibiotic agents status: Secondary | ICD-10-CM | POA: Diagnosis not present

## 2016-01-09 DIAGNOSIS — Z87891 Personal history of nicotine dependence: Secondary | ICD-10-CM | POA: Diagnosis not present

## 2016-01-09 DIAGNOSIS — E119 Type 2 diabetes mellitus without complications: Secondary | ICD-10-CM | POA: Diagnosis not present

## 2016-01-09 DIAGNOSIS — Z888 Allergy status to other drugs, medicaments and biological substances status: Secondary | ICD-10-CM | POA: Diagnosis not present

## 2016-01-23 DIAGNOSIS — R102 Pelvic and perineal pain: Secondary | ICD-10-CM | POA: Diagnosis not present

## 2016-01-23 DIAGNOSIS — R300 Dysuria: Secondary | ICD-10-CM | POA: Diagnosis not present

## 2016-02-04 DIAGNOSIS — E1142 Type 2 diabetes mellitus with diabetic polyneuropathy: Secondary | ICD-10-CM | POA: Diagnosis not present

## 2016-02-14 DIAGNOSIS — R300 Dysuria: Secondary | ICD-10-CM | POA: Diagnosis not present

## 2016-03-02 DIAGNOSIS — E782 Mixed hyperlipidemia: Secondary | ICD-10-CM | POA: Diagnosis not present

## 2016-03-02 DIAGNOSIS — C61 Malignant neoplasm of prostate: Secondary | ICD-10-CM | POA: Diagnosis not present

## 2016-03-02 DIAGNOSIS — E1165 Type 2 diabetes mellitus with hyperglycemia: Secondary | ICD-10-CM | POA: Diagnosis not present

## 2016-03-04 DIAGNOSIS — L700 Acne vulgaris: Secondary | ICD-10-CM | POA: Diagnosis not present

## 2016-03-04 DIAGNOSIS — R300 Dysuria: Secondary | ICD-10-CM | POA: Diagnosis not present

## 2016-03-10 DIAGNOSIS — F411 Generalized anxiety disorder: Secondary | ICD-10-CM | POA: Diagnosis not present

## 2016-03-10 DIAGNOSIS — E782 Mixed hyperlipidemia: Secondary | ICD-10-CM | POA: Diagnosis not present

## 2016-03-10 DIAGNOSIS — H402231 Chronic angle-closure glaucoma, bilateral, mild stage: Secondary | ICD-10-CM | POA: Diagnosis not present

## 2016-03-10 DIAGNOSIS — H903 Sensorineural hearing loss, bilateral: Secondary | ICD-10-CM | POA: Diagnosis not present

## 2016-03-10 DIAGNOSIS — C61 Malignant neoplasm of prostate: Secondary | ICD-10-CM | POA: Diagnosis not present

## 2016-03-10 DIAGNOSIS — Z1389 Encounter for screening for other disorder: Secondary | ICD-10-CM | POA: Diagnosis not present

## 2016-03-10 DIAGNOSIS — E1165 Type 2 diabetes mellitus with hyperglycemia: Secondary | ICD-10-CM | POA: Diagnosis not present

## 2016-03-10 DIAGNOSIS — D519 Vitamin B12 deficiency anemia, unspecified: Secondary | ICD-10-CM | POA: Diagnosis not present

## 2016-04-21 DIAGNOSIS — E1142 Type 2 diabetes mellitus with diabetic polyneuropathy: Secondary | ICD-10-CM | POA: Diagnosis not present

## 2016-05-07 DIAGNOSIS — H401131 Primary open-angle glaucoma, bilateral, mild stage: Secondary | ICD-10-CM | POA: Diagnosis not present

## 2016-05-14 ENCOUNTER — Ambulatory Visit (INDEPENDENT_AMBULATORY_CARE_PROVIDER_SITE_OTHER): Payer: Medicare Other | Admitting: Otolaryngology

## 2016-05-14 DIAGNOSIS — K219 Gastro-esophageal reflux disease without esophagitis: Secondary | ICD-10-CM | POA: Diagnosis not present

## 2016-05-14 DIAGNOSIS — R070 Pain in throat: Secondary | ICD-10-CM | POA: Diagnosis not present

## 2016-05-14 DIAGNOSIS — H903 Sensorineural hearing loss, bilateral: Secondary | ICD-10-CM | POA: Diagnosis not present

## 2016-05-14 DIAGNOSIS — H6123 Impacted cerumen, bilateral: Secondary | ICD-10-CM | POA: Diagnosis not present

## 2016-06-11 DIAGNOSIS — F411 Generalized anxiety disorder: Secondary | ICD-10-CM | POA: Diagnosis not present

## 2016-06-11 DIAGNOSIS — E782 Mixed hyperlipidemia: Secondary | ICD-10-CM | POA: Diagnosis not present

## 2016-06-11 DIAGNOSIS — I10 Essential (primary) hypertension: Secondary | ICD-10-CM | POA: Diagnosis not present

## 2016-06-11 DIAGNOSIS — D519 Vitamin B12 deficiency anemia, unspecified: Secondary | ICD-10-CM | POA: Diagnosis not present

## 2016-06-11 DIAGNOSIS — E1165 Type 2 diabetes mellitus with hyperglycemia: Secondary | ICD-10-CM | POA: Diagnosis not present

## 2016-06-11 DIAGNOSIS — K210 Gastro-esophageal reflux disease with esophagitis: Secondary | ICD-10-CM | POA: Diagnosis not present

## 2016-06-15 DIAGNOSIS — D519 Vitamin B12 deficiency anemia, unspecified: Secondary | ICD-10-CM | POA: Diagnosis not present

## 2016-06-15 DIAGNOSIS — Z6821 Body mass index (BMI) 21.0-21.9, adult: Secondary | ICD-10-CM | POA: Diagnosis not present

## 2016-06-15 DIAGNOSIS — E1165 Type 2 diabetes mellitus with hyperglycemia: Secondary | ICD-10-CM | POA: Diagnosis not present

## 2016-06-15 DIAGNOSIS — C61 Malignant neoplasm of prostate: Secondary | ICD-10-CM | POA: Diagnosis not present

## 2016-06-15 DIAGNOSIS — F411 Generalized anxiety disorder: Secondary | ICD-10-CM | POA: Diagnosis not present

## 2016-06-15 DIAGNOSIS — H903 Sensorineural hearing loss, bilateral: Secondary | ICD-10-CM | POA: Diagnosis not present

## 2016-06-15 DIAGNOSIS — H402231 Chronic angle-closure glaucoma, bilateral, mild stage: Secondary | ICD-10-CM | POA: Diagnosis not present

## 2016-06-15 DIAGNOSIS — E782 Mixed hyperlipidemia: Secondary | ICD-10-CM | POA: Diagnosis not present

## 2016-06-22 DIAGNOSIS — I361 Nonrheumatic tricuspid (valve) insufficiency: Secondary | ICD-10-CM | POA: Diagnosis not present

## 2016-06-22 DIAGNOSIS — I340 Nonrheumatic mitral (valve) insufficiency: Secondary | ICD-10-CM | POA: Diagnosis not present

## 2016-06-22 DIAGNOSIS — R011 Cardiac murmur, unspecified: Secondary | ICD-10-CM | POA: Diagnosis not present

## 2016-06-22 DIAGNOSIS — I351 Nonrheumatic aortic (valve) insufficiency: Secondary | ICD-10-CM | POA: Diagnosis not present

## 2016-06-29 DIAGNOSIS — C61 Malignant neoplasm of prostate: Secondary | ICD-10-CM | POA: Diagnosis not present

## 2016-06-29 DIAGNOSIS — N99115 Postprocedural fossa navicularis urethral stricture: Secondary | ICD-10-CM | POA: Diagnosis not present

## 2016-07-02 DIAGNOSIS — R300 Dysuria: Secondary | ICD-10-CM | POA: Diagnosis not present

## 2016-07-02 DIAGNOSIS — C61 Malignant neoplasm of prostate: Secondary | ICD-10-CM | POA: Diagnosis not present

## 2016-07-02 DIAGNOSIS — N99115 Postprocedural fossa navicularis urethral stricture: Secondary | ICD-10-CM | POA: Diagnosis not present

## 2016-07-02 DIAGNOSIS — N35011 Post-traumatic bulbous urethral stricture: Secondary | ICD-10-CM | POA: Diagnosis not present

## 2016-07-03 DIAGNOSIS — L570 Actinic keratosis: Secondary | ICD-10-CM | POA: Diagnosis not present

## 2016-07-13 ENCOUNTER — Other Ambulatory Visit: Payer: Self-pay

## 2016-07-13 DIAGNOSIS — L570 Actinic keratosis: Secondary | ICD-10-CM | POA: Diagnosis not present

## 2016-07-14 DIAGNOSIS — E1142 Type 2 diabetes mellitus with diabetic polyneuropathy: Secondary | ICD-10-CM | POA: Diagnosis not present

## 2016-07-16 DIAGNOSIS — J301 Allergic rhinitis due to pollen: Secondary | ICD-10-CM | POA: Diagnosis not present

## 2016-07-16 DIAGNOSIS — Z6821 Body mass index (BMI) 21.0-21.9, adult: Secondary | ICD-10-CM | POA: Diagnosis not present

## 2016-08-13 DIAGNOSIS — Z881 Allergy status to other antibiotic agents status: Secondary | ICD-10-CM | POA: Diagnosis not present

## 2016-08-13 DIAGNOSIS — Z87891 Personal history of nicotine dependence: Secondary | ICD-10-CM | POA: Diagnosis not present

## 2016-08-13 DIAGNOSIS — Z974 Presence of external hearing-aid: Secondary | ICD-10-CM | POA: Diagnosis not present

## 2016-08-13 DIAGNOSIS — H903 Sensorineural hearing loss, bilateral: Secondary | ICD-10-CM | POA: Diagnosis not present

## 2016-08-13 DIAGNOSIS — E119 Type 2 diabetes mellitus without complications: Secondary | ICD-10-CM | POA: Diagnosis not present

## 2016-08-13 DIAGNOSIS — H6123 Impacted cerumen, bilateral: Secondary | ICD-10-CM | POA: Diagnosis not present

## 2016-08-13 DIAGNOSIS — Z9889 Other specified postprocedural states: Secondary | ICD-10-CM | POA: Diagnosis not present

## 2016-08-13 DIAGNOSIS — Z888 Allergy status to other drugs, medicaments and biological substances status: Secondary | ICD-10-CM | POA: Diagnosis not present

## 2016-08-24 DIAGNOSIS — H401131 Primary open-angle glaucoma, bilateral, mild stage: Secondary | ICD-10-CM | POA: Diagnosis not present

## 2016-09-18 DIAGNOSIS — I10 Essential (primary) hypertension: Secondary | ICD-10-CM | POA: Diagnosis not present

## 2016-09-18 DIAGNOSIS — E782 Mixed hyperlipidemia: Secondary | ICD-10-CM | POA: Diagnosis not present

## 2016-09-18 DIAGNOSIS — D519 Vitamin B12 deficiency anemia, unspecified: Secondary | ICD-10-CM | POA: Diagnosis not present

## 2016-09-18 DIAGNOSIS — K210 Gastro-esophageal reflux disease with esophagitis: Secondary | ICD-10-CM | POA: Diagnosis not present

## 2016-09-18 DIAGNOSIS — E1165 Type 2 diabetes mellitus with hyperglycemia: Secondary | ICD-10-CM | POA: Diagnosis not present

## 2016-09-23 DIAGNOSIS — L820 Inflamed seborrheic keratosis: Secondary | ICD-10-CM | POA: Diagnosis not present

## 2016-09-23 DIAGNOSIS — E1165 Type 2 diabetes mellitus with hyperglycemia: Secondary | ICD-10-CM | POA: Diagnosis not present

## 2016-09-23 DIAGNOSIS — Z23 Encounter for immunization: Secondary | ICD-10-CM | POA: Diagnosis not present

## 2016-09-23 DIAGNOSIS — H402231 Chronic angle-closure glaucoma, bilateral, mild stage: Secondary | ICD-10-CM | POA: Diagnosis not present

## 2016-09-23 DIAGNOSIS — C61 Malignant neoplasm of prostate: Secondary | ICD-10-CM | POA: Diagnosis not present

## 2016-09-23 DIAGNOSIS — E782 Mixed hyperlipidemia: Secondary | ICD-10-CM | POA: Diagnosis not present

## 2016-09-23 DIAGNOSIS — D519 Vitamin B12 deficiency anemia, unspecified: Secondary | ICD-10-CM | POA: Diagnosis not present

## 2016-09-23 DIAGNOSIS — F411 Generalized anxiety disorder: Secondary | ICD-10-CM | POA: Diagnosis not present

## 2016-09-29 DIAGNOSIS — E1142 Type 2 diabetes mellitus with diabetic polyneuropathy: Secondary | ICD-10-CM | POA: Diagnosis not present

## 2016-10-29 DIAGNOSIS — C61 Malignant neoplasm of prostate: Secondary | ICD-10-CM | POA: Diagnosis not present

## 2016-11-02 DIAGNOSIS — H401131 Primary open-angle glaucoma, bilateral, mild stage: Secondary | ICD-10-CM | POA: Diagnosis not present

## 2016-11-12 DIAGNOSIS — N401 Enlarged prostate with lower urinary tract symptoms: Secondary | ICD-10-CM | POA: Diagnosis not present

## 2016-11-12 DIAGNOSIS — H6123 Impacted cerumen, bilateral: Secondary | ICD-10-CM | POA: Diagnosis not present

## 2016-11-12 DIAGNOSIS — Z6821 Body mass index (BMI) 21.0-21.9, adult: Secondary | ICD-10-CM | POA: Diagnosis not present

## 2016-11-13 DIAGNOSIS — R3915 Urgency of urination: Secondary | ICD-10-CM | POA: Diagnosis not present

## 2016-11-13 DIAGNOSIS — N99115 Postprocedural fossa navicularis urethral stricture: Secondary | ICD-10-CM | POA: Diagnosis not present

## 2016-11-13 DIAGNOSIS — C61 Malignant neoplasm of prostate: Secondary | ICD-10-CM | POA: Diagnosis not present

## 2016-11-13 DIAGNOSIS — N35011 Post-traumatic bulbous urethral stricture: Secondary | ICD-10-CM | POA: Diagnosis not present

## 2016-11-24 DIAGNOSIS — Z6821 Body mass index (BMI) 21.0-21.9, adult: Secondary | ICD-10-CM | POA: Diagnosis not present

## 2016-11-24 DIAGNOSIS — H6092 Unspecified otitis externa, left ear: Secondary | ICD-10-CM | POA: Diagnosis not present

## 2016-12-15 DIAGNOSIS — E1140 Type 2 diabetes mellitus with diabetic neuropathy, unspecified: Secondary | ICD-10-CM | POA: Diagnosis not present

## 2016-12-15 DIAGNOSIS — E1151 Type 2 diabetes mellitus with diabetic peripheral angiopathy without gangrene: Secondary | ICD-10-CM | POA: Diagnosis not present

## 2017-01-04 ENCOUNTER — Ambulatory Visit (INDEPENDENT_AMBULATORY_CARE_PROVIDER_SITE_OTHER): Payer: Medicare Other | Admitting: Otolaryngology

## 2017-01-04 DIAGNOSIS — H6121 Impacted cerumen, right ear: Secondary | ICD-10-CM | POA: Diagnosis not present

## 2017-01-04 DIAGNOSIS — H903 Sensorineural hearing loss, bilateral: Secondary | ICD-10-CM

## 2017-01-19 DIAGNOSIS — D519 Vitamin B12 deficiency anemia, unspecified: Secondary | ICD-10-CM | POA: Diagnosis not present

## 2017-01-19 DIAGNOSIS — E1165 Type 2 diabetes mellitus with hyperglycemia: Secondary | ICD-10-CM | POA: Diagnosis not present

## 2017-01-19 DIAGNOSIS — E782 Mixed hyperlipidemia: Secondary | ICD-10-CM | POA: Diagnosis not present

## 2017-01-21 DIAGNOSIS — Z23 Encounter for immunization: Secondary | ICD-10-CM | POA: Diagnosis not present

## 2017-01-21 DIAGNOSIS — E782 Mixed hyperlipidemia: Secondary | ICD-10-CM | POA: Diagnosis not present

## 2017-01-21 DIAGNOSIS — C61 Malignant neoplasm of prostate: Secondary | ICD-10-CM | POA: Diagnosis not present

## 2017-01-21 DIAGNOSIS — D519 Vitamin B12 deficiency anemia, unspecified: Secondary | ICD-10-CM | POA: Diagnosis not present

## 2017-01-21 DIAGNOSIS — E1165 Type 2 diabetes mellitus with hyperglycemia: Secondary | ICD-10-CM | POA: Diagnosis not present

## 2017-02-12 DIAGNOSIS — H906 Mixed conductive and sensorineural hearing loss, bilateral: Secondary | ICD-10-CM | POA: Diagnosis not present

## 2017-02-12 DIAGNOSIS — Z881 Allergy status to other antibiotic agents status: Secondary | ICD-10-CM | POA: Diagnosis not present

## 2017-02-12 DIAGNOSIS — Z888 Allergy status to other drugs, medicaments and biological substances status: Secondary | ICD-10-CM | POA: Diagnosis not present

## 2017-02-12 DIAGNOSIS — H6123 Impacted cerumen, bilateral: Secondary | ICD-10-CM | POA: Diagnosis not present

## 2017-02-12 DIAGNOSIS — E1136 Type 2 diabetes mellitus with diabetic cataract: Secondary | ICD-10-CM | POA: Diagnosis not present

## 2017-02-12 DIAGNOSIS — Z87891 Personal history of nicotine dependence: Secondary | ICD-10-CM | POA: Diagnosis not present

## 2017-02-19 DIAGNOSIS — Z6821 Body mass index (BMI) 21.0-21.9, adult: Secondary | ICD-10-CM | POA: Diagnosis not present

## 2017-02-19 DIAGNOSIS — S20211A Contusion of right front wall of thorax, initial encounter: Secondary | ICD-10-CM | POA: Diagnosis not present

## 2017-02-26 DIAGNOSIS — R51 Headache: Secondary | ICD-10-CM | POA: Diagnosis not present

## 2017-02-26 DIAGNOSIS — M542 Cervicalgia: Secondary | ICD-10-CM | POA: Diagnosis not present

## 2017-02-26 DIAGNOSIS — Z79899 Other long term (current) drug therapy: Secondary | ICD-10-CM | POA: Diagnosis not present

## 2017-02-26 DIAGNOSIS — S299XXA Unspecified injury of thorax, initial encounter: Secondary | ICD-10-CM | POA: Diagnosis not present

## 2017-02-26 DIAGNOSIS — Z7982 Long term (current) use of aspirin: Secondary | ICD-10-CM | POA: Diagnosis not present

## 2017-02-26 DIAGNOSIS — R220 Localized swelling, mass and lump, head: Secondary | ICD-10-CM | POA: Diagnosis not present

## 2017-02-26 DIAGNOSIS — Z7984 Long term (current) use of oral hypoglycemic drugs: Secondary | ICD-10-CM | POA: Diagnosis not present

## 2017-02-26 DIAGNOSIS — K219 Gastro-esophageal reflux disease without esophagitis: Secondary | ICD-10-CM | POA: Diagnosis not present

## 2017-02-26 DIAGNOSIS — E785 Hyperlipidemia, unspecified: Secondary | ICD-10-CM | POA: Diagnosis not present

## 2017-02-26 DIAGNOSIS — Z87891 Personal history of nicotine dependence: Secondary | ICD-10-CM | POA: Diagnosis not present

## 2017-02-26 DIAGNOSIS — S0990XA Unspecified injury of head, initial encounter: Secondary | ICD-10-CM | POA: Diagnosis not present

## 2017-02-26 DIAGNOSIS — W01198A Fall on same level from slipping, tripping and stumbling with subsequent striking against other object, initial encounter: Secondary | ICD-10-CM | POA: Diagnosis not present

## 2017-02-26 DIAGNOSIS — M1990 Unspecified osteoarthritis, unspecified site: Secondary | ICD-10-CM | POA: Diagnosis not present

## 2017-02-26 DIAGNOSIS — E119 Type 2 diabetes mellitus without complications: Secondary | ICD-10-CM | POA: Diagnosis not present

## 2017-02-26 DIAGNOSIS — S0003XA Contusion of scalp, initial encounter: Secondary | ICD-10-CM | POA: Diagnosis not present

## 2017-02-27 DIAGNOSIS — S0990XA Unspecified injury of head, initial encounter: Secondary | ICD-10-CM | POA: Diagnosis not present

## 2017-02-27 DIAGNOSIS — S299XXA Unspecified injury of thorax, initial encounter: Secondary | ICD-10-CM | POA: Diagnosis not present

## 2017-02-27 DIAGNOSIS — R220 Localized swelling, mass and lump, head: Secondary | ICD-10-CM | POA: Diagnosis not present

## 2017-03-01 DIAGNOSIS — Z6821 Body mass index (BMI) 21.0-21.9, adult: Secondary | ICD-10-CM | POA: Diagnosis not present

## 2017-03-01 DIAGNOSIS — R55 Syncope and collapse: Secondary | ICD-10-CM | POA: Diagnosis not present

## 2017-03-02 DIAGNOSIS — E1140 Type 2 diabetes mellitus with diabetic neuropathy, unspecified: Secondary | ICD-10-CM | POA: Diagnosis not present

## 2017-03-02 DIAGNOSIS — E1151 Type 2 diabetes mellitus with diabetic peripheral angiopathy without gangrene: Secondary | ICD-10-CM | POA: Diagnosis not present

## 2017-03-12 DIAGNOSIS — R269 Unspecified abnormalities of gait and mobility: Secondary | ICD-10-CM | POA: Diagnosis not present

## 2017-03-12 DIAGNOSIS — M6281 Muscle weakness (generalized): Secondary | ICD-10-CM | POA: Diagnosis not present

## 2017-03-12 DIAGNOSIS — R296 Repeated falls: Secondary | ICD-10-CM | POA: Diagnosis not present

## 2017-03-16 DIAGNOSIS — M6281 Muscle weakness (generalized): Secondary | ICD-10-CM | POA: Diagnosis not present

## 2017-03-16 DIAGNOSIS — M79672 Pain in left foot: Secondary | ICD-10-CM | POA: Diagnosis not present

## 2017-03-16 DIAGNOSIS — R269 Unspecified abnormalities of gait and mobility: Secondary | ICD-10-CM | POA: Diagnosis not present

## 2017-03-16 DIAGNOSIS — M25579 Pain in unspecified ankle and joints of unspecified foot: Secondary | ICD-10-CM | POA: Diagnosis not present

## 2017-03-16 DIAGNOSIS — M79671 Pain in right foot: Secondary | ICD-10-CM | POA: Diagnosis not present

## 2017-03-19 DIAGNOSIS — R269 Unspecified abnormalities of gait and mobility: Secondary | ICD-10-CM | POA: Diagnosis not present

## 2017-03-19 DIAGNOSIS — M6281 Muscle weakness (generalized): Secondary | ICD-10-CM | POA: Diagnosis not present

## 2017-03-22 DIAGNOSIS — M6281 Muscle weakness (generalized): Secondary | ICD-10-CM | POA: Diagnosis not present

## 2017-03-22 DIAGNOSIS — R269 Unspecified abnormalities of gait and mobility: Secondary | ICD-10-CM | POA: Diagnosis not present

## 2017-03-25 DIAGNOSIS — Z6821 Body mass index (BMI) 21.0-21.9, adult: Secondary | ICD-10-CM | POA: Diagnosis not present

## 2017-03-25 DIAGNOSIS — S39011A Strain of muscle, fascia and tendon of abdomen, initial encounter: Secondary | ICD-10-CM | POA: Diagnosis not present

## 2017-03-26 DIAGNOSIS — M6281 Muscle weakness (generalized): Secondary | ICD-10-CM | POA: Diagnosis not present

## 2017-03-26 DIAGNOSIS — R269 Unspecified abnormalities of gait and mobility: Secondary | ICD-10-CM | POA: Diagnosis not present

## 2017-03-30 DIAGNOSIS — M6281 Muscle weakness (generalized): Secondary | ICD-10-CM | POA: Diagnosis not present

## 2017-03-30 DIAGNOSIS — R269 Unspecified abnormalities of gait and mobility: Secondary | ICD-10-CM | POA: Diagnosis not present

## 2017-04-02 DIAGNOSIS — R269 Unspecified abnormalities of gait and mobility: Secondary | ICD-10-CM | POA: Diagnosis not present

## 2017-04-02 DIAGNOSIS — M6281 Muscle weakness (generalized): Secondary | ICD-10-CM | POA: Diagnosis not present

## 2017-04-05 DIAGNOSIS — M6281 Muscle weakness (generalized): Secondary | ICD-10-CM | POA: Diagnosis not present

## 2017-04-05 DIAGNOSIS — R269 Unspecified abnormalities of gait and mobility: Secondary | ICD-10-CM | POA: Diagnosis not present

## 2017-04-09 DIAGNOSIS — R269 Unspecified abnormalities of gait and mobility: Secondary | ICD-10-CM | POA: Diagnosis not present

## 2017-04-09 DIAGNOSIS — M6281 Muscle weakness (generalized): Secondary | ICD-10-CM | POA: Diagnosis not present

## 2017-04-13 DIAGNOSIS — M6281 Muscle weakness (generalized): Secondary | ICD-10-CM | POA: Diagnosis not present

## 2017-04-13 DIAGNOSIS — R269 Unspecified abnormalities of gait and mobility: Secondary | ICD-10-CM | POA: Diagnosis not present

## 2017-04-16 DIAGNOSIS — R296 Repeated falls: Secondary | ICD-10-CM | POA: Diagnosis not present

## 2017-05-03 DIAGNOSIS — H401131 Primary open-angle glaucoma, bilateral, mild stage: Secondary | ICD-10-CM | POA: Diagnosis not present

## 2017-05-20 DIAGNOSIS — R5383 Other fatigue: Secondary | ICD-10-CM | POA: Diagnosis not present

## 2017-05-20 DIAGNOSIS — E782 Mixed hyperlipidemia: Secondary | ICD-10-CM | POA: Diagnosis not present

## 2017-05-25 DIAGNOSIS — C61 Malignant neoplasm of prostate: Secondary | ICD-10-CM | POA: Diagnosis not present

## 2017-05-25 DIAGNOSIS — I10 Essential (primary) hypertension: Secondary | ICD-10-CM | POA: Diagnosis not present

## 2017-05-25 DIAGNOSIS — D519 Vitamin B12 deficiency anemia, unspecified: Secondary | ICD-10-CM | POA: Diagnosis not present

## 2017-05-25 DIAGNOSIS — E1165 Type 2 diabetes mellitus with hyperglycemia: Secondary | ICD-10-CM | POA: Diagnosis not present

## 2017-05-25 DIAGNOSIS — E782 Mixed hyperlipidemia: Secondary | ICD-10-CM | POA: Diagnosis not present

## 2017-06-01 DIAGNOSIS — E1140 Type 2 diabetes mellitus with diabetic neuropathy, unspecified: Secondary | ICD-10-CM | POA: Diagnosis not present

## 2017-06-01 DIAGNOSIS — E1151 Type 2 diabetes mellitus with diabetic peripheral angiopathy without gangrene: Secondary | ICD-10-CM | POA: Diagnosis not present

## 2017-07-15 DIAGNOSIS — M545 Low back pain: Secondary | ICD-10-CM | POA: Diagnosis not present

## 2017-07-15 DIAGNOSIS — Z6820 Body mass index (BMI) 20.0-20.9, adult: Secondary | ICD-10-CM | POA: Diagnosis not present

## 2017-07-15 DIAGNOSIS — H9202 Otalgia, left ear: Secondary | ICD-10-CM | POA: Diagnosis not present

## 2017-07-20 DIAGNOSIS — H04123 Dry eye syndrome of bilateral lacrimal glands: Secondary | ICD-10-CM | POA: Diagnosis not present

## 2017-08-20 DIAGNOSIS — R51 Headache: Secondary | ICD-10-CM | POA: Diagnosis not present

## 2017-08-20 DIAGNOSIS — Z6820 Body mass index (BMI) 20.0-20.9, adult: Secondary | ICD-10-CM | POA: Diagnosis not present

## 2017-08-24 DIAGNOSIS — E1140 Type 2 diabetes mellitus with diabetic neuropathy, unspecified: Secondary | ICD-10-CM | POA: Diagnosis not present

## 2017-08-24 DIAGNOSIS — E1151 Type 2 diabetes mellitus with diabetic peripheral angiopathy without gangrene: Secondary | ICD-10-CM | POA: Diagnosis not present

## 2017-08-27 DIAGNOSIS — S0990XA Unspecified injury of head, initial encounter: Secondary | ICD-10-CM | POA: Diagnosis not present

## 2017-08-27 DIAGNOSIS — R9082 White matter disease, unspecified: Secondary | ICD-10-CM | POA: Diagnosis not present

## 2017-08-27 DIAGNOSIS — M47812 Spondylosis without myelopathy or radiculopathy, cervical region: Secondary | ICD-10-CM | POA: Diagnosis not present

## 2017-08-27 DIAGNOSIS — M542 Cervicalgia: Secondary | ICD-10-CM | POA: Diagnosis not present

## 2017-08-27 DIAGNOSIS — G939 Disorder of brain, unspecified: Secondary | ICD-10-CM | POA: Diagnosis not present

## 2017-08-27 DIAGNOSIS — G319 Degenerative disease of nervous system, unspecified: Secondary | ICD-10-CM | POA: Diagnosis not present

## 2017-08-27 DIAGNOSIS — R51 Headache: Secondary | ICD-10-CM | POA: Diagnosis not present

## 2017-08-27 DIAGNOSIS — M4802 Spinal stenosis, cervical region: Secondary | ICD-10-CM | POA: Diagnosis not present

## 2017-09-02 DIAGNOSIS — Z881 Allergy status to other antibiotic agents status: Secondary | ICD-10-CM | POA: Diagnosis not present

## 2017-09-02 DIAGNOSIS — Z888 Allergy status to other drugs, medicaments and biological substances status: Secondary | ICD-10-CM | POA: Diagnosis not present

## 2017-09-02 DIAGNOSIS — E119 Type 2 diabetes mellitus without complications: Secondary | ICD-10-CM | POA: Diagnosis not present

## 2017-09-02 DIAGNOSIS — H906 Mixed conductive and sensorineural hearing loss, bilateral: Secondary | ICD-10-CM | POA: Diagnosis not present

## 2017-09-02 DIAGNOSIS — H905 Unspecified sensorineural hearing loss: Secondary | ICD-10-CM | POA: Diagnosis not present

## 2017-09-02 DIAGNOSIS — Z87891 Personal history of nicotine dependence: Secondary | ICD-10-CM | POA: Diagnosis not present

## 2017-09-02 DIAGNOSIS — H6123 Impacted cerumen, bilateral: Secondary | ICD-10-CM | POA: Diagnosis not present

## 2017-09-02 DIAGNOSIS — Z974 Presence of external hearing-aid: Secondary | ICD-10-CM | POA: Diagnosis not present

## 2017-09-03 DIAGNOSIS — Z6820 Body mass index (BMI) 20.0-20.9, adult: Secondary | ICD-10-CM | POA: Diagnosis not present

## 2017-09-03 DIAGNOSIS — H6242 Otitis externa in other diseases classified elsewhere, left ear: Secondary | ICD-10-CM | POA: Diagnosis not present

## 2017-09-09 ENCOUNTER — Ambulatory Visit (INDEPENDENT_AMBULATORY_CARE_PROVIDER_SITE_OTHER): Payer: Medicare Other | Admitting: Otolaryngology

## 2017-09-09 DIAGNOSIS — H608X3 Other otitis externa, bilateral: Secondary | ICD-10-CM | POA: Diagnosis not present

## 2017-09-09 DIAGNOSIS — H903 Sensorineural hearing loss, bilateral: Secondary | ICD-10-CM | POA: Diagnosis not present

## 2017-09-23 DIAGNOSIS — H906 Mixed conductive and sensorineural hearing loss, bilateral: Secondary | ICD-10-CM | POA: Diagnosis not present

## 2017-09-23 DIAGNOSIS — Z974 Presence of external hearing-aid: Secondary | ICD-10-CM | POA: Diagnosis not present

## 2017-09-23 DIAGNOSIS — H9222 Otorrhagia, left ear: Secondary | ICD-10-CM | POA: Diagnosis not present

## 2017-09-23 DIAGNOSIS — H905 Unspecified sensorineural hearing loss: Secondary | ICD-10-CM | POA: Diagnosis not present

## 2017-09-27 DIAGNOSIS — Z0001 Encounter for general adult medical examination with abnormal findings: Secondary | ICD-10-CM | POA: Diagnosis not present

## 2017-09-27 DIAGNOSIS — Z6821 Body mass index (BMI) 21.0-21.9, adult: Secondary | ICD-10-CM | POA: Diagnosis not present

## 2017-09-27 DIAGNOSIS — H9202 Otalgia, left ear: Secondary | ICD-10-CM | POA: Diagnosis not present

## 2017-11-02 DIAGNOSIS — E119 Type 2 diabetes mellitus without complications: Secondary | ICD-10-CM | POA: Diagnosis not present

## 2017-11-02 DIAGNOSIS — Z79899 Other long term (current) drug therapy: Secondary | ICD-10-CM | POA: Diagnosis not present

## 2017-11-02 DIAGNOSIS — H906 Mixed conductive and sensorineural hearing loss, bilateral: Secondary | ICD-10-CM | POA: Diagnosis not present

## 2017-11-02 DIAGNOSIS — Z9181 History of falling: Secondary | ICD-10-CM | POA: Diagnosis not present

## 2017-11-02 DIAGNOSIS — H6123 Impacted cerumen, bilateral: Secondary | ICD-10-CM | POA: Diagnosis not present

## 2017-11-02 DIAGNOSIS — H905 Unspecified sensorineural hearing loss: Secondary | ICD-10-CM | POA: Diagnosis not present

## 2017-11-02 DIAGNOSIS — Z87891 Personal history of nicotine dependence: Secondary | ICD-10-CM | POA: Diagnosis not present

## 2017-11-02 DIAGNOSIS — Z888 Allergy status to other drugs, medicaments and biological substances status: Secondary | ICD-10-CM | POA: Diagnosis not present

## 2017-11-02 DIAGNOSIS — Z881 Allergy status to other antibiotic agents status: Secondary | ICD-10-CM | POA: Diagnosis not present

## 2017-11-02 DIAGNOSIS — Z9889 Other specified postprocedural states: Secondary | ICD-10-CM | POA: Diagnosis not present

## 2017-11-30 DIAGNOSIS — E1140 Type 2 diabetes mellitus with diabetic neuropathy, unspecified: Secondary | ICD-10-CM | POA: Diagnosis not present

## 2017-11-30 DIAGNOSIS — E1151 Type 2 diabetes mellitus with diabetic peripheral angiopathy without gangrene: Secondary | ICD-10-CM | POA: Diagnosis not present

## 2017-12-01 DIAGNOSIS — I10 Essential (primary) hypertension: Secondary | ICD-10-CM | POA: Diagnosis not present

## 2017-12-01 DIAGNOSIS — E782 Mixed hyperlipidemia: Secondary | ICD-10-CM | POA: Diagnosis not present

## 2017-12-01 DIAGNOSIS — D519 Vitamin B12 deficiency anemia, unspecified: Secondary | ICD-10-CM | POA: Diagnosis not present

## 2017-12-01 DIAGNOSIS — Z9189 Other specified personal risk factors, not elsewhere classified: Secondary | ICD-10-CM | POA: Diagnosis not present

## 2017-12-10 DIAGNOSIS — H402231 Chronic angle-closure glaucoma, bilateral, mild stage: Secondary | ICD-10-CM | POA: Diagnosis not present

## 2017-12-10 DIAGNOSIS — E1165 Type 2 diabetes mellitus with hyperglycemia: Secondary | ICD-10-CM | POA: Diagnosis not present

## 2017-12-10 DIAGNOSIS — D519 Vitamin B12 deficiency anemia, unspecified: Secondary | ICD-10-CM | POA: Diagnosis not present

## 2017-12-10 DIAGNOSIS — Z0001 Encounter for general adult medical examination with abnormal findings: Secondary | ICD-10-CM | POA: Diagnosis not present

## 2017-12-10 DIAGNOSIS — C61 Malignant neoplasm of prostate: Secondary | ICD-10-CM | POA: Diagnosis not present

## 2017-12-10 DIAGNOSIS — Z23 Encounter for immunization: Secondary | ICD-10-CM | POA: Diagnosis not present

## 2018-02-07 DIAGNOSIS — H905 Unspecified sensorineural hearing loss: Secondary | ICD-10-CM | POA: Diagnosis not present

## 2018-02-07 DIAGNOSIS — H903 Sensorineural hearing loss, bilateral: Secondary | ICD-10-CM | POA: Diagnosis not present

## 2018-02-07 DIAGNOSIS — H6123 Impacted cerumen, bilateral: Secondary | ICD-10-CM | POA: Diagnosis not present

## 2018-02-07 DIAGNOSIS — H906 Mixed conductive and sensorineural hearing loss, bilateral: Secondary | ICD-10-CM | POA: Diagnosis not present

## 2018-02-22 DIAGNOSIS — E1151 Type 2 diabetes mellitus with diabetic peripheral angiopathy without gangrene: Secondary | ICD-10-CM | POA: Diagnosis not present

## 2018-02-22 DIAGNOSIS — E1140 Type 2 diabetes mellitus with diabetic neuropathy, unspecified: Secondary | ICD-10-CM | POA: Diagnosis not present

## 2018-03-10 ENCOUNTER — Ambulatory Visit (INDEPENDENT_AMBULATORY_CARE_PROVIDER_SITE_OTHER): Payer: Medicare Other | Admitting: Otolaryngology

## 2018-03-10 DIAGNOSIS — H6121 Impacted cerumen, right ear: Secondary | ICD-10-CM | POA: Diagnosis not present

## 2018-04-05 DIAGNOSIS — H402231 Chronic angle-closure glaucoma, bilateral, mild stage: Secondary | ICD-10-CM | POA: Diagnosis not present

## 2018-04-05 DIAGNOSIS — D519 Vitamin B12 deficiency anemia, unspecified: Secondary | ICD-10-CM | POA: Diagnosis not present

## 2018-04-05 DIAGNOSIS — M542 Cervicalgia: Secondary | ICD-10-CM | POA: Diagnosis not present

## 2018-04-05 DIAGNOSIS — C61 Malignant neoplasm of prostate: Secondary | ICD-10-CM | POA: Diagnosis not present

## 2018-04-05 DIAGNOSIS — E782 Mixed hyperlipidemia: Secondary | ICD-10-CM | POA: Diagnosis not present

## 2018-04-05 DIAGNOSIS — E1165 Type 2 diabetes mellitus with hyperglycemia: Secondary | ICD-10-CM | POA: Diagnosis not present

## 2018-05-11 DIAGNOSIS — H401131 Primary open-angle glaucoma, bilateral, mild stage: Secondary | ICD-10-CM | POA: Diagnosis not present

## 2018-05-24 DIAGNOSIS — E1140 Type 2 diabetes mellitus with diabetic neuropathy, unspecified: Secondary | ICD-10-CM | POA: Diagnosis not present

## 2018-05-24 DIAGNOSIS — E1151 Type 2 diabetes mellitus with diabetic peripheral angiopathy without gangrene: Secondary | ICD-10-CM | POA: Diagnosis not present

## 2018-06-20 DIAGNOSIS — H401131 Primary open-angle glaucoma, bilateral, mild stage: Secondary | ICD-10-CM | POA: Diagnosis not present

## 2018-07-04 DIAGNOSIS — E1165 Type 2 diabetes mellitus with hyperglycemia: Secondary | ICD-10-CM | POA: Diagnosis not present

## 2018-07-04 DIAGNOSIS — Z9189 Other specified personal risk factors, not elsewhere classified: Secondary | ICD-10-CM | POA: Diagnosis not present

## 2018-07-04 DIAGNOSIS — Z7689 Persons encountering health services in other specified circumstances: Secondary | ICD-10-CM | POA: Diagnosis not present

## 2018-07-04 DIAGNOSIS — K210 Gastro-esophageal reflux disease with esophagitis: Secondary | ICD-10-CM | POA: Diagnosis not present

## 2018-07-04 DIAGNOSIS — E039 Hypothyroidism, unspecified: Secondary | ICD-10-CM | POA: Diagnosis not present

## 2018-07-04 DIAGNOSIS — R011 Cardiac murmur, unspecified: Secondary | ICD-10-CM | POA: Diagnosis not present

## 2018-07-07 DIAGNOSIS — E782 Mixed hyperlipidemia: Secondary | ICD-10-CM | POA: Diagnosis not present

## 2018-07-07 DIAGNOSIS — D519 Vitamin B12 deficiency anemia, unspecified: Secondary | ICD-10-CM | POA: Diagnosis not present

## 2018-07-07 DIAGNOSIS — C61 Malignant neoplasm of prostate: Secondary | ICD-10-CM | POA: Diagnosis not present

## 2018-07-07 DIAGNOSIS — H402231 Chronic angle-closure glaucoma, bilateral, mild stage: Secondary | ICD-10-CM | POA: Diagnosis not present

## 2018-08-08 DIAGNOSIS — E1151 Type 2 diabetes mellitus with diabetic peripheral angiopathy without gangrene: Secondary | ICD-10-CM | POA: Diagnosis not present

## 2018-08-08 DIAGNOSIS — Z23 Encounter for immunization: Secondary | ICD-10-CM | POA: Diagnosis not present

## 2018-08-08 DIAGNOSIS — E1140 Type 2 diabetes mellitus with diabetic neuropathy, unspecified: Secondary | ICD-10-CM | POA: Diagnosis not present

## 2018-09-08 ENCOUNTER — Ambulatory Visit (INDEPENDENT_AMBULATORY_CARE_PROVIDER_SITE_OTHER): Payer: Medicare Other | Admitting: Otolaryngology

## 2018-09-08 DIAGNOSIS — H6123 Impacted cerumen, bilateral: Secondary | ICD-10-CM

## 2018-09-14 DIAGNOSIS — M1990 Unspecified osteoarthritis, unspecified site: Secondary | ICD-10-CM | POA: Diagnosis not present

## 2018-09-14 DIAGNOSIS — K210 Gastro-esophageal reflux disease with esophagitis: Secondary | ICD-10-CM | POA: Diagnosis not present

## 2018-11-09 DIAGNOSIS — R300 Dysuria: Secondary | ICD-10-CM | POA: Diagnosis not present

## 2018-11-09 DIAGNOSIS — M545 Low back pain: Secondary | ICD-10-CM | POA: Diagnosis not present

## 2018-11-09 DIAGNOSIS — J0191 Acute recurrent sinusitis, unspecified: Secondary | ICD-10-CM | POA: Diagnosis not present

## 2018-11-09 DIAGNOSIS — M546 Pain in thoracic spine: Secondary | ICD-10-CM | POA: Diagnosis not present

## 2018-11-09 DIAGNOSIS — Z7982 Long term (current) use of aspirin: Secondary | ICD-10-CM | POA: Diagnosis not present

## 2018-11-09 DIAGNOSIS — H538 Other visual disturbances: Secondary | ICD-10-CM | POA: Diagnosis not present

## 2018-11-09 DIAGNOSIS — Z7984 Long term (current) use of oral hypoglycemic drugs: Secondary | ICD-10-CM | POA: Diagnosis not present

## 2018-11-09 DIAGNOSIS — Z87891 Personal history of nicotine dependence: Secondary | ICD-10-CM | POA: Diagnosis not present

## 2018-11-09 DIAGNOSIS — Z79899 Other long term (current) drug therapy: Secondary | ICD-10-CM | POA: Diagnosis not present

## 2018-11-09 DIAGNOSIS — R600 Localized edema: Secondary | ICD-10-CM | POA: Diagnosis not present

## 2018-11-09 DIAGNOSIS — R040 Epistaxis: Secondary | ICD-10-CM | POA: Diagnosis not present

## 2018-11-09 DIAGNOSIS — E119 Type 2 diabetes mellitus without complications: Secondary | ICD-10-CM | POA: Diagnosis not present

## 2018-11-09 DIAGNOSIS — M47814 Spondylosis without myelopathy or radiculopathy, thoracic region: Secondary | ICD-10-CM | POA: Diagnosis not present

## 2018-11-28 DIAGNOSIS — E1151 Type 2 diabetes mellitus with diabetic peripheral angiopathy without gangrene: Secondary | ICD-10-CM | POA: Diagnosis not present

## 2018-11-28 DIAGNOSIS — E1140 Type 2 diabetes mellitus with diabetic neuropathy, unspecified: Secondary | ICD-10-CM | POA: Diagnosis not present

## 2018-12-14 DIAGNOSIS — R5383 Other fatigue: Secondary | ICD-10-CM | POA: Diagnosis not present

## 2018-12-14 DIAGNOSIS — E1165 Type 2 diabetes mellitus with hyperglycemia: Secondary | ICD-10-CM | POA: Diagnosis not present

## 2018-12-14 DIAGNOSIS — E782 Mixed hyperlipidemia: Secondary | ICD-10-CM | POA: Diagnosis not present

## 2018-12-14 DIAGNOSIS — K210 Gastro-esophageal reflux disease with esophagitis: Secondary | ICD-10-CM | POA: Diagnosis not present

## 2018-12-14 DIAGNOSIS — I10 Essential (primary) hypertension: Secondary | ICD-10-CM | POA: Diagnosis not present

## 2018-12-21 DIAGNOSIS — Z0001 Encounter for general adult medical examination with abnormal findings: Secondary | ICD-10-CM | POA: Diagnosis not present

## 2018-12-21 DIAGNOSIS — I10 Essential (primary) hypertension: Secondary | ICD-10-CM | POA: Diagnosis not present

## 2019-03-16 DIAGNOSIS — I10 Essential (primary) hypertension: Secondary | ICD-10-CM | POA: Diagnosis not present

## 2019-03-16 DIAGNOSIS — E782 Mixed hyperlipidemia: Secondary | ICD-10-CM | POA: Diagnosis not present

## 2019-03-16 DIAGNOSIS — E1165 Type 2 diabetes mellitus with hyperglycemia: Secondary | ICD-10-CM | POA: Diagnosis not present

## 2019-04-19 DIAGNOSIS — I351 Nonrheumatic aortic (valve) insufficiency: Secondary | ICD-10-CM | POA: Diagnosis not present

## 2019-04-19 DIAGNOSIS — H903 Sensorineural hearing loss, bilateral: Secondary | ICD-10-CM | POA: Diagnosis not present

## 2019-04-19 DIAGNOSIS — I10 Essential (primary) hypertension: Secondary | ICD-10-CM | POA: Diagnosis not present

## 2019-04-19 DIAGNOSIS — D519 Vitamin B12 deficiency anemia, unspecified: Secondary | ICD-10-CM | POA: Diagnosis not present

## 2019-04-24 DIAGNOSIS — E1140 Type 2 diabetes mellitus with diabetic neuropathy, unspecified: Secondary | ICD-10-CM | POA: Diagnosis not present

## 2019-04-24 DIAGNOSIS — E1151 Type 2 diabetes mellitus with diabetic peripheral angiopathy without gangrene: Secondary | ICD-10-CM | POA: Diagnosis not present

## 2019-05-01 DIAGNOSIS — K219 Gastro-esophageal reflux disease without esophagitis: Secondary | ICD-10-CM | POA: Diagnosis not present

## 2019-05-29 ENCOUNTER — Other Ambulatory Visit: Payer: Self-pay

## 2019-05-29 ENCOUNTER — Ambulatory Visit (INDEPENDENT_AMBULATORY_CARE_PROVIDER_SITE_OTHER): Payer: Medicare Other | Admitting: Otolaryngology

## 2019-05-29 DIAGNOSIS — H903 Sensorineural hearing loss, bilateral: Secondary | ICD-10-CM | POA: Diagnosis not present

## 2019-05-29 DIAGNOSIS — H6123 Impacted cerumen, bilateral: Secondary | ICD-10-CM

## 2019-06-06 DIAGNOSIS — R21 Rash and other nonspecific skin eruption: Secondary | ICD-10-CM | POA: Diagnosis not present

## 2019-06-19 DIAGNOSIS — B372 Candidiasis of skin and nail: Secondary | ICD-10-CM | POA: Diagnosis not present

## 2019-06-20 DIAGNOSIS — D3132 Benign neoplasm of left choroid: Secondary | ICD-10-CM | POA: Diagnosis not present

## 2019-06-20 DIAGNOSIS — H524 Presbyopia: Secondary | ICD-10-CM | POA: Diagnosis not present

## 2019-07-10 DIAGNOSIS — E1151 Type 2 diabetes mellitus with diabetic peripheral angiopathy without gangrene: Secondary | ICD-10-CM | POA: Diagnosis not present

## 2019-07-10 DIAGNOSIS — E1140 Type 2 diabetes mellitus with diabetic neuropathy, unspecified: Secondary | ICD-10-CM | POA: Diagnosis not present

## 2019-07-17 DIAGNOSIS — L03031 Cellulitis of right toe: Secondary | ICD-10-CM | POA: Diagnosis not present

## 2019-07-17 DIAGNOSIS — E782 Mixed hyperlipidemia: Secondary | ICD-10-CM | POA: Diagnosis not present

## 2019-07-17 DIAGNOSIS — L600 Ingrowing nail: Secondary | ICD-10-CM | POA: Diagnosis not present

## 2019-07-17 DIAGNOSIS — E1165 Type 2 diabetes mellitus with hyperglycemia: Secondary | ICD-10-CM | POA: Diagnosis not present

## 2019-07-17 DIAGNOSIS — M79674 Pain in right toe(s): Secondary | ICD-10-CM | POA: Diagnosis not present

## 2019-07-17 DIAGNOSIS — M79671 Pain in right foot: Secondary | ICD-10-CM | POA: Diagnosis not present

## 2019-07-18 DIAGNOSIS — E1165 Type 2 diabetes mellitus with hyperglycemia: Secondary | ICD-10-CM | POA: Diagnosis not present

## 2019-07-18 DIAGNOSIS — R5383 Other fatigue: Secondary | ICD-10-CM | POA: Diagnosis not present

## 2019-07-18 DIAGNOSIS — E782 Mixed hyperlipidemia: Secondary | ICD-10-CM | POA: Diagnosis not present

## 2019-07-18 DIAGNOSIS — K210 Gastro-esophageal reflux disease with esophagitis: Secondary | ICD-10-CM | POA: Diagnosis not present

## 2019-07-21 DIAGNOSIS — Z23 Encounter for immunization: Secondary | ICD-10-CM | POA: Diagnosis not present

## 2019-07-21 DIAGNOSIS — I10 Essential (primary) hypertension: Secondary | ICD-10-CM | POA: Diagnosis not present

## 2019-07-21 DIAGNOSIS — H402231 Chronic angle-closure glaucoma, bilateral, mild stage: Secondary | ICD-10-CM | POA: Diagnosis not present

## 2019-07-21 DIAGNOSIS — D519 Vitamin B12 deficiency anemia, unspecified: Secondary | ICD-10-CM | POA: Diagnosis not present

## 2019-07-21 DIAGNOSIS — E782 Mixed hyperlipidemia: Secondary | ICD-10-CM | POA: Diagnosis not present

## 2019-07-27 DIAGNOSIS — C44529 Squamous cell carcinoma of skin of other part of trunk: Secondary | ICD-10-CM | POA: Diagnosis not present

## 2019-07-31 DIAGNOSIS — M79674 Pain in right toe(s): Secondary | ICD-10-CM | POA: Diagnosis not present

## 2019-07-31 DIAGNOSIS — L600 Ingrowing nail: Secondary | ICD-10-CM | POA: Diagnosis not present

## 2019-07-31 DIAGNOSIS — M79671 Pain in right foot: Secondary | ICD-10-CM | POA: Diagnosis not present

## 2019-08-30 DIAGNOSIS — B372 Candidiasis of skin and nail: Secondary | ICD-10-CM | POA: Diagnosis not present

## 2019-10-02 DIAGNOSIS — E1140 Type 2 diabetes mellitus with diabetic neuropathy, unspecified: Secondary | ICD-10-CM | POA: Diagnosis not present

## 2019-10-02 DIAGNOSIS — E1151 Type 2 diabetes mellitus with diabetic peripheral angiopathy without gangrene: Secondary | ICD-10-CM | POA: Diagnosis not present

## 2019-10-23 DIAGNOSIS — S0081XA Abrasion of other part of head, initial encounter: Secondary | ICD-10-CM | POA: Diagnosis not present

## 2019-10-23 DIAGNOSIS — E1165 Type 2 diabetes mellitus with hyperglycemia: Secondary | ICD-10-CM | POA: Diagnosis not present

## 2019-10-23 DIAGNOSIS — R55 Syncope and collapse: Secondary | ICD-10-CM | POA: Diagnosis not present

## 2019-10-24 DIAGNOSIS — S0990XA Unspecified injury of head, initial encounter: Secondary | ICD-10-CM | POA: Diagnosis not present

## 2019-10-24 DIAGNOSIS — R55 Syncope and collapse: Secondary | ICD-10-CM | POA: Diagnosis not present

## 2019-10-29 DIAGNOSIS — J0100 Acute maxillary sinusitis, unspecified: Secondary | ICD-10-CM | POA: Diagnosis not present

## 2019-10-29 DIAGNOSIS — J069 Acute upper respiratory infection, unspecified: Secondary | ICD-10-CM | POA: Diagnosis not present

## 2019-10-29 DIAGNOSIS — R05 Cough: Secondary | ICD-10-CM | POA: Diagnosis not present

## 2019-10-29 DIAGNOSIS — R0602 Shortness of breath: Secondary | ICD-10-CM | POA: Diagnosis not present

## 2019-10-29 DIAGNOSIS — R52 Pain, unspecified: Secondary | ICD-10-CM | POA: Diagnosis not present

## 2019-11-16 DIAGNOSIS — I10 Essential (primary) hypertension: Secondary | ICD-10-CM | POA: Diagnosis not present

## 2019-11-16 DIAGNOSIS — R55 Syncope and collapse: Secondary | ICD-10-CM | POA: Diagnosis not present

## 2019-12-04 DIAGNOSIS — H6123 Impacted cerumen, bilateral: Secondary | ICD-10-CM | POA: Diagnosis not present

## 2019-12-19 DIAGNOSIS — K2100 Gastro-esophageal reflux disease with esophagitis, without bleeding: Secondary | ICD-10-CM | POA: Diagnosis not present

## 2019-12-19 DIAGNOSIS — E782 Mixed hyperlipidemia: Secondary | ICD-10-CM | POA: Diagnosis not present

## 2019-12-19 DIAGNOSIS — R5383 Other fatigue: Secondary | ICD-10-CM | POA: Diagnosis not present

## 2019-12-19 DIAGNOSIS — I10 Essential (primary) hypertension: Secondary | ICD-10-CM | POA: Diagnosis not present

## 2019-12-25 DIAGNOSIS — R4582 Worries: Secondary | ICD-10-CM | POA: Diagnosis not present

## 2019-12-25 DIAGNOSIS — I10 Essential (primary) hypertension: Secondary | ICD-10-CM | POA: Diagnosis not present

## 2019-12-25 DIAGNOSIS — D519 Vitamin B12 deficiency anemia, unspecified: Secondary | ICD-10-CM | POA: Diagnosis not present

## 2019-12-25 DIAGNOSIS — H402231 Chronic angle-closure glaucoma, bilateral, mild stage: Secondary | ICD-10-CM | POA: Diagnosis not present

## 2019-12-25 DIAGNOSIS — Z0001 Encounter for general adult medical examination with abnormal findings: Secondary | ICD-10-CM | POA: Diagnosis not present

## 2020-01-01 DIAGNOSIS — E1151 Type 2 diabetes mellitus with diabetic peripheral angiopathy without gangrene: Secondary | ICD-10-CM | POA: Diagnosis not present

## 2020-01-01 DIAGNOSIS — E1140 Type 2 diabetes mellitus with diabetic neuropathy, unspecified: Secondary | ICD-10-CM | POA: Diagnosis not present

## 2020-01-02 DIAGNOSIS — L309 Dermatitis, unspecified: Secondary | ICD-10-CM | POA: Diagnosis not present

## 2020-01-02 DIAGNOSIS — L853 Xerosis cutis: Secondary | ICD-10-CM | POA: Diagnosis not present

## 2020-03-18 DIAGNOSIS — E1140 Type 2 diabetes mellitus with diabetic neuropathy, unspecified: Secondary | ICD-10-CM | POA: Diagnosis not present

## 2020-03-18 DIAGNOSIS — E1151 Type 2 diabetes mellitus with diabetic peripheral angiopathy without gangrene: Secondary | ICD-10-CM | POA: Diagnosis not present

## 2020-04-17 DIAGNOSIS — E1165 Type 2 diabetes mellitus with hyperglycemia: Secondary | ICD-10-CM | POA: Diagnosis not present

## 2020-04-17 DIAGNOSIS — I10 Essential (primary) hypertension: Secondary | ICD-10-CM | POA: Diagnosis not present

## 2020-04-17 DIAGNOSIS — K219 Gastro-esophageal reflux disease without esophagitis: Secondary | ICD-10-CM | POA: Diagnosis not present

## 2020-04-17 DIAGNOSIS — E782 Mixed hyperlipidemia: Secondary | ICD-10-CM | POA: Diagnosis not present

## 2020-04-17 DIAGNOSIS — E1169 Type 2 diabetes mellitus with other specified complication: Secondary | ICD-10-CM | POA: Diagnosis not present

## 2020-04-24 DIAGNOSIS — E782 Mixed hyperlipidemia: Secondary | ICD-10-CM | POA: Diagnosis not present

## 2020-04-24 DIAGNOSIS — D519 Vitamin B12 deficiency anemia, unspecified: Secondary | ICD-10-CM | POA: Diagnosis not present

## 2020-04-24 DIAGNOSIS — R4582 Worries: Secondary | ICD-10-CM | POA: Diagnosis not present

## 2020-04-24 DIAGNOSIS — H402231 Chronic angle-closure glaucoma, bilateral, mild stage: Secondary | ICD-10-CM | POA: Diagnosis not present

## 2020-04-24 DIAGNOSIS — H903 Sensorineural hearing loss, bilateral: Secondary | ICD-10-CM | POA: Diagnosis not present

## 2020-04-30 DIAGNOSIS — I10 Essential (primary) hypertension: Secondary | ICD-10-CM | POA: Diagnosis not present

## 2020-04-30 DIAGNOSIS — E782 Mixed hyperlipidemia: Secondary | ICD-10-CM | POA: Diagnosis not present

## 2020-04-30 DIAGNOSIS — H903 Sensorineural hearing loss, bilateral: Secondary | ICD-10-CM | POA: Diagnosis not present

## 2020-04-30 DIAGNOSIS — Z0001 Encounter for general adult medical examination with abnormal findings: Secondary | ICD-10-CM | POA: Diagnosis not present

## 2020-04-30 DIAGNOSIS — Z681 Body mass index (BMI) 19 or less, adult: Secondary | ICD-10-CM | POA: Diagnosis not present

## 2020-06-03 DIAGNOSIS — H6123 Impacted cerumen, bilateral: Secondary | ICD-10-CM | POA: Diagnosis not present

## 2020-06-07 DIAGNOSIS — Z681 Body mass index (BMI) 19 or less, adult: Secondary | ICD-10-CM | POA: Diagnosis not present

## 2020-06-07 DIAGNOSIS — K1120 Sialoadenitis, unspecified: Secondary | ICD-10-CM | POA: Diagnosis not present

## 2020-06-10 DIAGNOSIS — E1151 Type 2 diabetes mellitus with diabetic peripheral angiopathy without gangrene: Secondary | ICD-10-CM | POA: Diagnosis not present

## 2020-06-10 DIAGNOSIS — E1140 Type 2 diabetes mellitus with diabetic neuropathy, unspecified: Secondary | ICD-10-CM | POA: Diagnosis not present

## 2020-07-02 DIAGNOSIS — J329 Chronic sinusitis, unspecified: Secondary | ICD-10-CM | POA: Diagnosis not present

## 2020-07-08 DIAGNOSIS — R509 Fever, unspecified: Secondary | ICD-10-CM | POA: Diagnosis not present

## 2020-07-31 DIAGNOSIS — R05 Cough: Secondary | ICD-10-CM | POA: Diagnosis not present

## 2020-07-31 DIAGNOSIS — K219 Gastro-esophageal reflux disease without esophagitis: Secondary | ICD-10-CM | POA: Diagnosis not present

## 2020-08-07 DIAGNOSIS — E782 Mixed hyperlipidemia: Secondary | ICD-10-CM | POA: Diagnosis not present

## 2020-08-07 DIAGNOSIS — Z23 Encounter for immunization: Secondary | ICD-10-CM | POA: Diagnosis not present

## 2020-08-07 DIAGNOSIS — H402231 Chronic angle-closure glaucoma, bilateral, mild stage: Secondary | ICD-10-CM | POA: Diagnosis not present

## 2020-08-07 DIAGNOSIS — I10 Essential (primary) hypertension: Secondary | ICD-10-CM | POA: Diagnosis not present

## 2020-08-07 DIAGNOSIS — D519 Vitamin B12 deficiency anemia, unspecified: Secondary | ICD-10-CM | POA: Diagnosis not present

## 2020-08-21 DIAGNOSIS — I340 Nonrheumatic mitral (valve) insufficiency: Secondary | ICD-10-CM | POA: Diagnosis not present

## 2020-08-21 DIAGNOSIS — E782 Mixed hyperlipidemia: Secondary | ICD-10-CM | POA: Diagnosis not present

## 2020-08-21 DIAGNOSIS — K2100 Gastro-esophageal reflux disease with esophagitis, without bleeding: Secondary | ICD-10-CM | POA: Diagnosis not present

## 2020-08-21 DIAGNOSIS — E1165 Type 2 diabetes mellitus with hyperglycemia: Secondary | ICD-10-CM | POA: Diagnosis not present

## 2020-08-21 DIAGNOSIS — E1169 Type 2 diabetes mellitus with other specified complication: Secondary | ICD-10-CM | POA: Diagnosis not present

## 2020-08-28 DIAGNOSIS — I10 Essential (primary) hypertension: Secondary | ICD-10-CM | POA: Diagnosis not present

## 2020-08-28 DIAGNOSIS — E782 Mixed hyperlipidemia: Secondary | ICD-10-CM | POA: Diagnosis not present

## 2020-08-28 DIAGNOSIS — R4582 Worries: Secondary | ICD-10-CM | POA: Diagnosis not present

## 2020-08-28 DIAGNOSIS — I351 Nonrheumatic aortic (valve) insufficiency: Secondary | ICD-10-CM | POA: Diagnosis not present

## 2020-08-28 DIAGNOSIS — I340 Nonrheumatic mitral (valve) insufficiency: Secondary | ICD-10-CM | POA: Diagnosis not present

## 2020-09-03 DIAGNOSIS — H524 Presbyopia: Secondary | ICD-10-CM | POA: Diagnosis not present

## 2020-09-19 DIAGNOSIS — E1140 Type 2 diabetes mellitus with diabetic neuropathy, unspecified: Secondary | ICD-10-CM | POA: Diagnosis not present

## 2020-09-19 DIAGNOSIS — E1151 Type 2 diabetes mellitus with diabetic peripheral angiopathy without gangrene: Secondary | ICD-10-CM | POA: Diagnosis not present

## 2020-09-30 DIAGNOSIS — I509 Heart failure, unspecified: Secondary | ICD-10-CM | POA: Diagnosis not present

## 2020-09-30 DIAGNOSIS — Z681 Body mass index (BMI) 19 or less, adult: Secondary | ICD-10-CM | POA: Diagnosis not present

## 2020-09-30 DIAGNOSIS — K6389 Other specified diseases of intestine: Secondary | ICD-10-CM | POA: Diagnosis not present

## 2020-09-30 DIAGNOSIS — J90 Pleural effusion, not elsewhere classified: Secondary | ICD-10-CM | POA: Diagnosis not present

## 2020-09-30 DIAGNOSIS — K3189 Other diseases of stomach and duodenum: Secondary | ICD-10-CM | POA: Diagnosis not present

## 2020-09-30 DIAGNOSIS — M4316 Spondylolisthesis, lumbar region: Secondary | ICD-10-CM | POA: Diagnosis not present

## 2020-09-30 DIAGNOSIS — R079 Chest pain, unspecified: Secondary | ICD-10-CM | POA: Diagnosis not present

## 2020-09-30 DIAGNOSIS — I708 Atherosclerosis of other arteries: Secondary | ICD-10-CM | POA: Diagnosis not present

## 2020-09-30 DIAGNOSIS — R1013 Epigastric pain: Secondary | ICD-10-CM | POA: Diagnosis not present

## 2020-09-30 DIAGNOSIS — R9431 Abnormal electrocardiogram [ECG] [EKG]: Secondary | ICD-10-CM | POA: Diagnosis not present

## 2020-09-30 DIAGNOSIS — K5900 Constipation, unspecified: Secondary | ICD-10-CM | POA: Diagnosis not present

## 2020-09-30 DIAGNOSIS — N281 Cyst of kidney, acquired: Secondary | ICD-10-CM | POA: Diagnosis not present

## 2020-09-30 DIAGNOSIS — I214 Non-ST elevation (NSTEMI) myocardial infarction: Secondary | ICD-10-CM | POA: Diagnosis not present

## 2020-09-30 DIAGNOSIS — I700 Atherosclerosis of aorta: Secondary | ICD-10-CM | POA: Diagnosis not present

## 2020-09-30 DIAGNOSIS — Z881 Allergy status to other antibiotic agents status: Secondary | ICD-10-CM | POA: Diagnosis not present

## 2020-09-30 DIAGNOSIS — I313 Pericardial effusion (noninflammatory): Secondary | ICD-10-CM | POA: Diagnosis not present

## 2020-09-30 DIAGNOSIS — R109 Unspecified abdominal pain: Secondary | ICD-10-CM | POA: Diagnosis not present

## 2020-09-30 DIAGNOSIS — I739 Peripheral vascular disease, unspecified: Secondary | ICD-10-CM | POA: Diagnosis not present

## 2020-10-01 DIAGNOSIS — I314 Cardiac tamponade: Secondary | ICD-10-CM | POA: Diagnosis not present

## 2020-10-01 DIAGNOSIS — R918 Other nonspecific abnormal finding of lung field: Secondary | ICD-10-CM | POA: Diagnosis not present

## 2020-10-01 DIAGNOSIS — I348 Other nonrheumatic mitral valve disorders: Secondary | ICD-10-CM | POA: Diagnosis not present

## 2020-10-01 DIAGNOSIS — I214 Non-ST elevation (NSTEMI) myocardial infarction: Secondary | ICD-10-CM | POA: Diagnosis not present

## 2020-10-01 DIAGNOSIS — I313 Pericardial effusion (noninflammatory): Secondary | ICD-10-CM | POA: Diagnosis not present

## 2020-10-01 DIAGNOSIS — D62 Acute posthemorrhagic anemia: Secondary | ICD-10-CM | POA: Diagnosis not present

## 2020-10-01 DIAGNOSIS — R9431 Abnormal electrocardiogram [ECG] [EKG]: Secondary | ICD-10-CM | POA: Diagnosis not present

## 2020-10-01 DIAGNOSIS — I350 Nonrheumatic aortic (valve) stenosis: Secondary | ICD-10-CM | POA: Diagnosis not present

## 2020-10-01 DIAGNOSIS — I359 Nonrheumatic aortic valve disorder, unspecified: Secondary | ICD-10-CM | POA: Diagnosis not present

## 2020-10-01 DIAGNOSIS — Z4682 Encounter for fitting and adjustment of non-vascular catheter: Secondary | ICD-10-CM | POA: Diagnosis not present

## 2020-10-01 DIAGNOSIS — I708 Atherosclerosis of other arteries: Secondary | ICD-10-CM | POA: Diagnosis not present

## 2020-10-01 DIAGNOSIS — I517 Cardiomegaly: Secondary | ICD-10-CM | POA: Diagnosis not present

## 2020-10-01 DIAGNOSIS — I083 Combined rheumatic disorders of mitral, aortic and tricuspid valves: Secondary | ICD-10-CM | POA: Diagnosis not present

## 2020-10-01 DIAGNOSIS — I700 Atherosclerosis of aorta: Secondary | ICD-10-CM | POA: Diagnosis not present

## 2020-10-01 DIAGNOSIS — Z881 Allergy status to other antibiotic agents status: Secondary | ICD-10-CM | POA: Diagnosis not present

## 2020-10-01 DIAGNOSIS — I509 Heart failure, unspecified: Secondary | ICD-10-CM | POA: Diagnosis not present

## 2020-10-01 DIAGNOSIS — H9190 Unspecified hearing loss, unspecified ear: Secondary | ICD-10-CM | POA: Diagnosis not present

## 2020-10-01 DIAGNOSIS — J90 Pleural effusion, not elsewhere classified: Secondary | ICD-10-CM | POA: Diagnosis not present

## 2020-10-02 DIAGNOSIS — I359 Nonrheumatic aortic valve disorder, unspecified: Secondary | ICD-10-CM | POA: Diagnosis not present

## 2020-10-02 DIAGNOSIS — I313 Pericardial effusion (noninflammatory): Secondary | ICD-10-CM | POA: Diagnosis not present

## 2020-10-02 DIAGNOSIS — I314 Cardiac tamponade: Secondary | ICD-10-CM | POA: Diagnosis not present

## 2020-10-02 DIAGNOSIS — I214 Non-ST elevation (NSTEMI) myocardial infarction: Secondary | ICD-10-CM | POA: Diagnosis not present

## 2020-10-03 DIAGNOSIS — I517 Cardiomegaly: Secondary | ICD-10-CM | POA: Diagnosis not present

## 2020-10-03 DIAGNOSIS — I214 Non-ST elevation (NSTEMI) myocardial infarction: Secondary | ICD-10-CM | POA: Diagnosis not present

## 2020-10-03 DIAGNOSIS — I348 Other nonrheumatic mitral valve disorders: Secondary | ICD-10-CM | POA: Diagnosis not present

## 2020-10-03 DIAGNOSIS — I314 Cardiac tamponade: Secondary | ICD-10-CM | POA: Diagnosis not present

## 2020-10-03 DIAGNOSIS — I313 Pericardial effusion (noninflammatory): Secondary | ICD-10-CM | POA: Diagnosis not present

## 2020-10-04 DIAGNOSIS — I314 Cardiac tamponade: Secondary | ICD-10-CM | POA: Diagnosis not present

## 2020-10-04 DIAGNOSIS — J90 Pleural effusion, not elsewhere classified: Secondary | ICD-10-CM | POA: Diagnosis not present

## 2020-10-04 DIAGNOSIS — I214 Non-ST elevation (NSTEMI) myocardial infarction: Secondary | ICD-10-CM | POA: Diagnosis not present

## 2020-10-04 DIAGNOSIS — I517 Cardiomegaly: Secondary | ICD-10-CM | POA: Diagnosis not present

## 2020-10-04 DIAGNOSIS — I348 Other nonrheumatic mitral valve disorders: Secondary | ICD-10-CM | POA: Diagnosis not present

## 2020-10-04 DIAGNOSIS — I313 Pericardial effusion (noninflammatory): Secondary | ICD-10-CM | POA: Diagnosis not present

## 2020-10-06 DIAGNOSIS — E876 Hypokalemia: Secondary | ICD-10-CM | POA: Diagnosis not present

## 2020-10-06 DIAGNOSIS — Z9889 Other specified postprocedural states: Secondary | ICD-10-CM | POA: Diagnosis not present

## 2020-10-06 DIAGNOSIS — R918 Other nonspecific abnormal finding of lung field: Secondary | ICD-10-CM | POA: Diagnosis not present

## 2020-10-06 DIAGNOSIS — J811 Chronic pulmonary edema: Secondary | ICD-10-CM | POA: Diagnosis not present

## 2020-10-06 DIAGNOSIS — I5020 Unspecified systolic (congestive) heart failure: Secondary | ICD-10-CM | POA: Diagnosis not present

## 2020-10-06 DIAGNOSIS — R079 Chest pain, unspecified: Secondary | ICD-10-CM | POA: Diagnosis not present

## 2020-10-06 DIAGNOSIS — R531 Weakness: Secondary | ICD-10-CM | POA: Diagnosis not present

## 2020-10-06 DIAGNOSIS — I309 Acute pericarditis, unspecified: Secondary | ICD-10-CM | POA: Diagnosis not present

## 2020-10-06 DIAGNOSIS — J810 Acute pulmonary edema: Secondary | ICD-10-CM | POA: Diagnosis not present

## 2020-10-06 DIAGNOSIS — Z888 Allergy status to other drugs, medicaments and biological substances status: Secondary | ICD-10-CM | POA: Diagnosis not present

## 2020-10-06 DIAGNOSIS — I5189 Other ill-defined heart diseases: Secondary | ICD-10-CM | POA: Diagnosis not present

## 2020-10-06 DIAGNOSIS — R0602 Shortness of breath: Secondary | ICD-10-CM | POA: Diagnosis not present

## 2020-10-06 DIAGNOSIS — I080 Rheumatic disorders of both mitral and aortic valves: Secondary | ICD-10-CM | POA: Diagnosis not present

## 2020-10-06 DIAGNOSIS — I214 Non-ST elevation (NSTEMI) myocardial infarction: Secondary | ICD-10-CM | POA: Diagnosis not present

## 2020-10-06 DIAGNOSIS — R451 Restlessness and agitation: Secondary | ICD-10-CM | POA: Diagnosis not present

## 2020-10-06 DIAGNOSIS — Z87891 Personal history of nicotine dependence: Secondary | ICD-10-CM | POA: Diagnosis not present

## 2020-10-06 DIAGNOSIS — Z20822 Contact with and (suspected) exposure to covid-19: Secondary | ICD-10-CM | POA: Diagnosis not present

## 2020-10-06 DIAGNOSIS — I252 Old myocardial infarction: Secondary | ICD-10-CM | POA: Diagnosis not present

## 2020-10-06 DIAGNOSIS — I319 Disease of pericardium, unspecified: Secondary | ICD-10-CM | POA: Diagnosis not present

## 2020-10-06 DIAGNOSIS — Z681 Body mass index (BMI) 19 or less, adult: Secondary | ICD-10-CM | POA: Diagnosis not present

## 2020-10-06 DIAGNOSIS — Z79899 Other long term (current) drug therapy: Secondary | ICD-10-CM | POA: Diagnosis not present

## 2020-10-06 DIAGNOSIS — I313 Pericardial effusion (noninflammatory): Secondary | ICD-10-CM | POA: Diagnosis not present

## 2020-10-06 DIAGNOSIS — R109 Unspecified abdominal pain: Secondary | ICD-10-CM | POA: Diagnosis not present

## 2020-10-06 DIAGNOSIS — R9431 Abnormal electrocardiogram [ECG] [EKG]: Secondary | ICD-10-CM | POA: Diagnosis not present

## 2020-10-06 DIAGNOSIS — I350 Nonrheumatic aortic (valve) stenosis: Secondary | ICD-10-CM | POA: Diagnosis not present

## 2020-10-06 DIAGNOSIS — I517 Cardiomegaly: Secondary | ICD-10-CM | POA: Diagnosis not present

## 2020-10-06 DIAGNOSIS — J90 Pleural effusion, not elsewhere classified: Secondary | ICD-10-CM | POA: Diagnosis not present

## 2020-10-06 DIAGNOSIS — R000 Tachycardia, unspecified: Secondary | ICD-10-CM | POA: Diagnosis not present

## 2020-10-06 DIAGNOSIS — H9190 Unspecified hearing loss, unspecified ear: Secondary | ICD-10-CM | POA: Diagnosis not present

## 2020-10-06 DIAGNOSIS — Z881 Allergy status to other antibiotic agents status: Secondary | ICD-10-CM | POA: Diagnosis not present

## 2020-10-06 DIAGNOSIS — R0902 Hypoxemia: Secondary | ICD-10-CM | POA: Diagnosis not present

## 2020-10-06 DIAGNOSIS — Z139 Encounter for screening, unspecified: Secondary | ICD-10-CM | POA: Diagnosis not present

## 2020-10-06 DIAGNOSIS — R64 Cachexia: Secondary | ICD-10-CM | POA: Diagnosis not present

## 2020-10-06 DIAGNOSIS — Z66 Do not resuscitate: Secondary | ICD-10-CM | POA: Diagnosis not present

## 2020-10-06 DIAGNOSIS — I509 Heart failure, unspecified: Secondary | ICD-10-CM | POA: Diagnosis not present

## 2020-10-06 DIAGNOSIS — Z743 Need for continuous supervision: Secondary | ICD-10-CM | POA: Diagnosis not present

## 2020-10-07 DIAGNOSIS — J90 Pleural effusion, not elsewhere classified: Secondary | ICD-10-CM | POA: Diagnosis not present

## 2020-10-07 DIAGNOSIS — Z139 Encounter for screening, unspecified: Secondary | ICD-10-CM | POA: Diagnosis not present

## 2020-10-07 DIAGNOSIS — I5189 Other ill-defined heart diseases: Secondary | ICD-10-CM | POA: Diagnosis not present

## 2020-10-07 DIAGNOSIS — I080 Rheumatic disorders of both mitral and aortic valves: Secondary | ICD-10-CM | POA: Diagnosis not present

## 2020-10-07 DIAGNOSIS — R079 Chest pain, unspecified: Secondary | ICD-10-CM | POA: Diagnosis not present

## 2020-10-07 DIAGNOSIS — I350 Nonrheumatic aortic (valve) stenosis: Secondary | ICD-10-CM | POA: Diagnosis not present

## 2020-10-10 DIAGNOSIS — I350 Nonrheumatic aortic (valve) stenosis: Secondary | ICD-10-CM | POA: Diagnosis not present

## 2020-10-11 DIAGNOSIS — R079 Chest pain, unspecified: Secondary | ICD-10-CM | POA: Diagnosis not present

## 2020-10-11 DIAGNOSIS — J811 Chronic pulmonary edema: Secondary | ICD-10-CM | POA: Diagnosis not present

## 2020-10-11 DIAGNOSIS — I319 Disease of pericardium, unspecified: Secondary | ICD-10-CM | POA: Diagnosis not present

## 2020-10-11 DIAGNOSIS — I350 Nonrheumatic aortic (valve) stenosis: Secondary | ICD-10-CM | POA: Diagnosis not present

## 2020-10-11 DIAGNOSIS — J90 Pleural effusion, not elsewhere classified: Secondary | ICD-10-CM | POA: Diagnosis not present

## 2020-10-14 ENCOUNTER — Emergency Department (HOSPITAL_COMMUNITY): Payer: Medicare Other

## 2020-10-14 ENCOUNTER — Encounter (HOSPITAL_COMMUNITY): Payer: Self-pay | Admitting: Emergency Medicine

## 2020-10-14 ENCOUNTER — Other Ambulatory Visit: Payer: Self-pay

## 2020-10-14 DIAGNOSIS — R0689 Other abnormalities of breathing: Secondary | ICD-10-CM | POA: Diagnosis not present

## 2020-10-14 DIAGNOSIS — Z66 Do not resuscitate: Secondary | ICD-10-CM | POA: Diagnosis present

## 2020-10-14 DIAGNOSIS — R6251 Failure to thrive (child): Secondary | ICD-10-CM

## 2020-10-14 DIAGNOSIS — Z20822 Contact with and (suspected) exposure to covid-19: Secondary | ICD-10-CM | POA: Diagnosis present

## 2020-10-14 DIAGNOSIS — Z7982 Long term (current) use of aspirin: Secondary | ICD-10-CM

## 2020-10-14 DIAGNOSIS — R54 Age-related physical debility: Secondary | ICD-10-CM | POA: Diagnosis not present

## 2020-10-14 DIAGNOSIS — Z881 Allergy status to other antibiotic agents status: Secondary | ICD-10-CM

## 2020-10-14 DIAGNOSIS — I3139 Other pericardial effusion (noninflammatory): Secondary | ICD-10-CM

## 2020-10-14 DIAGNOSIS — I313 Pericardial effusion (noninflammatory): Secondary | ICD-10-CM | POA: Diagnosis present

## 2020-10-14 DIAGNOSIS — R4182 Altered mental status, unspecified: Secondary | ICD-10-CM

## 2020-10-14 DIAGNOSIS — I214 Non-ST elevation (NSTEMI) myocardial infarction: Secondary | ICD-10-CM | POA: Diagnosis not present

## 2020-10-14 DIAGNOSIS — E46 Unspecified protein-calorie malnutrition: Secondary | ICD-10-CM | POA: Diagnosis not present

## 2020-10-14 DIAGNOSIS — I499 Cardiac arrhythmia, unspecified: Secondary | ICD-10-CM | POA: Diagnosis not present

## 2020-10-14 DIAGNOSIS — R197 Diarrhea, unspecified: Secondary | ICD-10-CM | POA: Diagnosis not present

## 2020-10-14 DIAGNOSIS — I350 Nonrheumatic aortic (valve) stenosis: Secondary | ICD-10-CM | POA: Diagnosis not present

## 2020-10-14 DIAGNOSIS — Z79899 Other long term (current) drug therapy: Secondary | ICD-10-CM

## 2020-10-14 DIAGNOSIS — Z87891 Personal history of nicotine dependence: Secondary | ICD-10-CM

## 2020-10-14 DIAGNOSIS — Z888 Allergy status to other drugs, medicaments and biological substances status: Secondary | ICD-10-CM | POA: Diagnosis not present

## 2020-10-14 DIAGNOSIS — I5043 Acute on chronic combined systolic (congestive) and diastolic (congestive) heart failure: Secondary | ICD-10-CM

## 2020-10-14 DIAGNOSIS — N179 Acute kidney failure, unspecified: Secondary | ICD-10-CM | POA: Diagnosis not present

## 2020-10-14 DIAGNOSIS — R64 Cachexia: Secondary | ICD-10-CM | POA: Diagnosis present

## 2020-10-14 DIAGNOSIS — Z681 Body mass index (BMI) 19 or less, adult: Secondary | ICD-10-CM | POA: Diagnosis not present

## 2020-10-14 DIAGNOSIS — I959 Hypotension, unspecified: Secondary | ICD-10-CM | POA: Diagnosis not present

## 2020-10-14 DIAGNOSIS — H9190 Unspecified hearing loss, unspecified ear: Secondary | ICD-10-CM | POA: Diagnosis present

## 2020-10-14 DIAGNOSIS — R079 Chest pain, unspecified: Secondary | ICD-10-CM | POA: Diagnosis not present

## 2020-10-14 DIAGNOSIS — J811 Chronic pulmonary edema: Secondary | ICD-10-CM | POA: Diagnosis not present

## 2020-10-14 DIAGNOSIS — Z8546 Personal history of malignant neoplasm of prostate: Secondary | ICD-10-CM | POA: Diagnosis not present

## 2020-10-14 DIAGNOSIS — J90 Pleural effusion, not elsewhere classified: Secondary | ICD-10-CM | POA: Diagnosis not present

## 2020-10-14 DIAGNOSIS — Z743 Need for continuous supervision: Secondary | ICD-10-CM | POA: Diagnosis not present

## 2020-10-14 DIAGNOSIS — I429 Cardiomyopathy, unspecified: Secondary | ICD-10-CM | POA: Diagnosis present

## 2020-10-14 DIAGNOSIS — R627 Adult failure to thrive: Secondary | ICD-10-CM | POA: Diagnosis present

## 2020-10-14 DIAGNOSIS — R451 Restlessness and agitation: Secondary | ICD-10-CM | POA: Diagnosis not present

## 2020-10-14 DIAGNOSIS — R0789 Other chest pain: Secondary | ICD-10-CM | POA: Diagnosis not present

## 2020-10-14 HISTORY — DX: Malignant neoplasm of prostate: C61

## 2020-10-14 HISTORY — DX: Non-ST elevation (NSTEMI) myocardial infarction: I21.4

## 2020-10-14 HISTORY — DX: Cardiomyopathy, unspecified: I42.9

## 2020-10-14 HISTORY — DX: Other pericardial effusion (noninflammatory): I31.39

## 2020-10-14 HISTORY — DX: Nonrheumatic aortic (valve) stenosis: I35.0

## 2020-10-14 HISTORY — DX: Unspecified hearing loss, unspecified ear: H91.90

## 2020-10-14 HISTORY — DX: Pericardial effusion (noninflammatory): I31.3

## 2020-10-14 LAB — CBC WITH DIFFERENTIAL/PLATELET
Abs Immature Granulocytes: 0.07 10*3/uL (ref 0.00–0.07)
Basophils Absolute: 0 10*3/uL (ref 0.0–0.1)
Basophils Relative: 0 %
Eosinophils Absolute: 0 10*3/uL (ref 0.0–0.5)
Eosinophils Relative: 0 %
HCT: 32.8 % — ABNORMAL LOW (ref 39.0–52.0)
Hemoglobin: 10.6 g/dL — ABNORMAL LOW (ref 13.0–17.0)
Immature Granulocytes: 1 %
Lymphocytes Relative: 10 %
Lymphs Abs: 0.9 10*3/uL (ref 0.7–4.0)
MCH: 30.6 pg (ref 26.0–34.0)
MCHC: 32.3 g/dL (ref 30.0–36.0)
MCV: 94.8 fL (ref 80.0–100.0)
Monocytes Absolute: 0.8 10*3/uL (ref 0.1–1.0)
Monocytes Relative: 9 %
Neutro Abs: 7.2 10*3/uL (ref 1.7–7.7)
Neutrophils Relative %: 80 %
Platelets: 311 10*3/uL (ref 150–400)
RBC: 3.46 MIL/uL — ABNORMAL LOW (ref 4.22–5.81)
RDW: 15 % (ref 11.5–15.5)
WBC: 9 10*3/uL (ref 4.0–10.5)
nRBC: 0 % (ref 0.0–0.2)

## 2020-10-14 LAB — RESP PANEL BY RT-PCR (FLU A&B, COVID) ARPGX2
Influenza A by PCR: NEGATIVE
Influenza B by PCR: NEGATIVE
SARS Coronavirus 2 by RT PCR: NEGATIVE

## 2020-10-14 LAB — BASIC METABOLIC PANEL
Anion gap: 8 (ref 5–15)
BUN: 21 mg/dL (ref 8–23)
CO2: 24 mmol/L (ref 22–32)
Calcium: 8.8 mg/dL — ABNORMAL LOW (ref 8.9–10.3)
Chloride: 96 mmol/L — ABNORMAL LOW (ref 98–111)
Creatinine, Ser: 0.67 mg/dL (ref 0.61–1.24)
GFR, Estimated: >60 mL/min (ref >60)
Glucose, Bld: 99 mg/dL (ref 70–99)
Potassium: 4.3 mmol/L (ref 3.5–5.1)
Sodium: 128 mmol/L — ABNORMAL LOW (ref 135–145)

## 2020-10-14 LAB — TROPONIN I (HIGH SENSITIVITY)
Troponin I (High Sensitivity): 996 ng/L (ref <18)
Troponin I (High Sensitivity): 1053 ng/L (ref <18)

## 2020-10-14 LAB — BRAIN NATRIURETIC PEPTIDE: B Natriuretic Peptide: 3201 pg/mL — ABNORMAL HIGH (ref 0.0–100.0)

## 2020-10-14 MED ORDER — ASPIRIN EC 81 MG PO TBEC
81.0000 mg | DELAYED_RELEASE_TABLET | Freq: Every day | ORAL | Status: DC
  Administered 2020-10-14 – 2020-10-17 (×4): 81 mg via ORAL
  Filled 2020-10-14 (×4): qty 1

## 2020-10-14 MED ORDER — COLCHICINE 0.6 MG PO TABS
0.6000 mg | ORAL_TABLET | Freq: Two times a day (BID) | ORAL | Status: DC
  Administered 2020-10-14 – 2020-10-16 (×6): 0.6 mg via ORAL
  Filled 2020-10-14 (×7): qty 1

## 2020-10-14 MED ORDER — FUROSEMIDE 10 MG/ML IJ SOLN
40.0000 mg | Freq: Two times a day (BID) | INTRAMUSCULAR | Status: DC
  Administered 2020-10-14 – 2020-10-16 (×5): 40 mg via INTRAVENOUS
  Filled 2020-10-14 (×6): qty 4

## 2020-10-14 MED ORDER — TIMOLOL MALEATE 0.5 % OP SOLN
1.0000 [drp] | Freq: Every day | OPHTHALMIC | Status: DC
  Administered 2020-10-15 – 2020-10-17 (×3): 1 [drp] via OPHTHALMIC
  Filled 2020-10-14 (×2): qty 5

## 2020-10-14 MED ORDER — TAMSULOSIN HCL 0.4 MG PO CAPS
0.4000 mg | ORAL_CAPSULE | Freq: Every day | ORAL | Status: DC
  Administered 2020-10-14 – 2020-10-17 (×4): 0.4 mg via ORAL
  Filled 2020-10-14 (×5): qty 1

## 2020-10-14 MED ORDER — FUROSEMIDE 10 MG/ML IJ SOLN
40.0000 mg | Freq: Once | INTRAMUSCULAR | Status: AC
  Administered 2020-10-14: 40 mg via INTRAVENOUS
  Filled 2020-10-14: qty 4

## 2020-10-14 NOTE — ED Notes (Signed)
Pt states while administering Lasix, "If my heart stops, I do not want CPR", daughter at bedside and states she does not feel patient could withstand CPR/chest compressions due to frailness; daughter further states patient has lost over 80lbs; EDP made aware

## 2020-10-14 NOTE — ED Notes (Signed)
Date and time results received: 10/13/2020 0303   Test: TROPONIN Critical Value: 996  Name of Provider Notified: Betsey Holiday, MD

## 2020-10-14 NOTE — Consult Note (Addendum)
Cardiology Consultation:   Patient ID: Christopher Carr MRN: [id number]; DOB: [birth year]  Admit date: 10/11/2020 Date of Consult: 09/24/2020  Primary Care Provider: Caryl Bis, MD [facility] Cardiologist: Rozann Lesches, MD (New) [facility] Electrophysiologist:  None    Patient Profile:   Christopher Carr is a 86 y.o. male with severe AS, cardiomyopathy with recent acute on chronic combined heart failure, and large pericardial effusion with tamponade status post pericardiocentesis at [facility] in November who is being seen today for the evaluation of chest pain and CHF at the request of Dr. Waverly Ferrari.  He has been accepted in transfer to the cardiology service at [facility].  History of Present Illness:   Christopher Carr is a 86 yo male patient with recently diagnosed severe AS (not felt to be surgical candidate per [facility] notes) who underwent pericardiocentesis 10/01/20 with removal of 250 cc fluid for large pericardial effusion and tamponade. Readmitted to [facility] with NSTEMI and transferred to [facility], where he had a trace pericardial effusion and bilateral pleural effusions treated with IV Lasix. Echo 10/01/20 LVEF 50-55% large pericardial effusion with evidence of tamponade , severe AS peak gradient 84.7, mean 47.835. F/U echo 10/06/20 LVEF 45-50%.  Patient brought in by daughter to get into the [facility] system. Comes in with chest pain and shortness of breath. BNP 3201, troponin 996 but troponins over 8000 at [facility]. Patient extremely hard of hearing. Lived alone until recent hospitalizations but staying with a daughter now. Has had gradual weight loss but more in the past 6 months. Complains of orthopnea, chest pain with laying down and sensation of near syncope with laying down. Chest pain both sharp and a deep ache. Not eating well. Trouble urinating.   Past Medical History:  Diagnosis Date  . Cardiomyopathy ([place])   . Hearing loss   . NSTEMI (non-ST elevated myocardial  infarction) ([place])   . Pericardial effusion    Pericardiocentesis 09/2020 - [facility]  . Prostate cancer ([place])   . Severe aortic stenosis     Past Surgical History:  Procedure Laterality Date  . HIATAL HERNIA REPAIR       Outpatient Medications: No current facility-administered medications on file prior to encounter.   Current Outpatient Medications on File Prior to Encounter  Medication Sig Dispense Refill  . acetaminophen (TYLENOL) 325 MG tablet Take 325 mg by mouth every 6 (six) hours as needed for mild pain.     Marland Kitchen ALPRAZolam (XANAX) 0.25 MG tablet Take 0.25 mg by mouth in the morning, at noon, and at bedtime.    Marland Kitchen aspirin 81 MG EC tablet Take 81 mg by mouth daily.     . Cholecalciferol 50 MCG (2000 UT) TABS Take 2,000 Units by mouth daily.     . colchicine 0.6 MG tablet Take 0.6 mg by mouth daily.     Marland Kitchen loratadine (CLARITIN) 10 MG tablet Take 10 mg by mouth daily.    . Multiple Vitamins-Minerals (PRESERVISION AREDS) CAPS Take 1 capsule by mouth every morning.    Marland Kitchen omeprazole (PRILOSEC) 20 MG capsule Take 20 mg by mouth daily.    . ondansetron (ZOFRAN) 8 MG tablet Take 8 mg by mouth every 8 (eight) hours as needed for nausea or vomiting.     . polyethylene glycol powder (GLYCOLAX/MIRALAX) 17 GM/SCOOP powder Take 0.5 Containers by mouth daily as needed for mild constipation.     . tamsulosin (FLOMAX) 0.4 MG CAPS capsule Take 0.4 mg by mouth at bedtime.     Marland Kitchen  timolol (TIMOPTIC) 0.5 % ophthalmic solution Place 1 drop into both eyes daily.       Allergies:    Allergies  Allergen Reactions  . Fluticasone Other (See Comments) and Rash    Can't use because pt. Has glaucoma Can't use because pt. Has glaucoma   . Ciprofloxacin Other (See Comments)    Other reaction(s): GI Upset (intolerance)     Social History:   Social History   Tobacco Use  . Smoking status: Former Smoker    Types: Cigarettes  . Smokeless tobacco: Never Used  Substance Use Topics  . Alcohol use: Not  Currently    Family History:   Reviewed and unknown.  ROS:  Please see the history of present illness.  Review of Systems  Constitutional: Positive for decreased appetite, malaise/fatigue and weight loss.  HENT: Negative.   Cardiovascular: Positive for chest pain, dyspnea on exertion, leg swelling and orthopnea.  Respiratory: Positive for shortness of breath.   Endocrine: Negative.   Hematologic/Lymphatic: Negative.   Musculoskeletal: Positive for arthritis.  Gastrointestinal: Negative.   Genitourinary: Positive for frequency, hesitancy, nocturia and urgency.  Neurological: Positive for light-headedness and weakness.    All other ROS reviewed and negative.     Physical Exam/Data:   Vitals:   10/12/2020 0600 09/24/2020 0615 09/19/2020 0630 10/13/2020 0645  BP: (!) 109/50  (!) 98/46   Pulse: 91 96 79 86  Resp: (!) 25 (!) 26 (!) 21 18  Temp:      TempSrc:      SpO2: 97% 97% 97% 96%  Weight:      Height:        Intake/Output Summary (Last 24 hours) at 09/29/2020 0916 Last data filed at 09/29/2020 0200 Gross per 24 hour  Intake 240 ml  Output --  Net 240 ml   Last 3 Weights 09/18/2020  Weight (lbs) 100 lb  Weight (kg) 45.36 kg     Body mass index is 17.16 kg/m.  General: Thin, elderly in no acute distress, very hard of hearing HEENT: normal Lymph: no adenopathy Neck: no JVD Endocrine:  No thryomegaly Vascular: No carotid bruits; FA pulses 2+ bilaterally without bruits  Cardiac:  normal S1, RRR; 3/6 sys murmur with decrease S2 Lungs: scattered rhonchi and rales  Abd: soft, nontender, no hepatomegaly  Ext: trace ankle edema Musculoskeletal:  No deformities, BUE and BLE strength normal and equal Skin: warm and dry  Neuro:  CNs 2-12 intact, no focal abnormalities noted Psych:  Normal affect   EKG:  The EKG was personally reviewed and demonstrates: NSR with LVH and reporlarization changes. No EKG's to compare. EKG's at [facility] read out available but not actual  EKG  Telemetry:  Telemetry was personally reviewed and demonstrates: NSR  Relevant CV Studies:  Echocardiogram Limited WO Enhancing Agent  Collection Time: 10/07/20 9:54 AM  Result Value Ref Range  LVFS 2D 42.683 %  LV Diastolic Volume (BP) 41.962 mL  Left Ventricular EF by Teichholz Method 44.914 %  Left Ventricular EF by Teichholz Method 44.914 %  Left Ventricular End Systolic Volume 22.979 mL  Left Ventricular End Systolic Volume 89.211 mL  IVS 1.212 cm  IVSd 1.2 0.5 - 0.9 cm  LVIDD 3.73 cm  LVIDd 3.734 3.70 - 5.14 cm  LVIDS 2.92 cm  LVIDs 2.919 2.20 - 3.33 cm  LVPWD 1.044 cm  LVPWd 1.044 0.46 - 0.86 cm  Left ventricular stroke volume 26.680 mL  A4C EF 43 %  A4C EF 43 %  Left Ventricle Diastolic Major Axis 6.294 cm  Left Ventricle Diastolic Major Axis 7.654 cm  Left Atrium Dimension 2D 3.275 cm  MV A Vel 91.705 cm/s  E wave deceleration time 125.480 msec  E wave deceleration time 125.480 msec  MV E Vel 88.571 cm/s  E/A ratio 0.97 no units  E/A ratio 0.97 no units  E/E' ratio 12.16 no units  LV Peak Diastolic Tissue Velocity During (A Sys Lat) 10.016 cm/s  LV Peak Diastolic Tissue Velocity During (A Sys Lat) 10.016 cm/s  LV Peak Diastolic Tissue Velocity During (A Sys Med) 4.550 cm/s  LV Peak Diastolic Tissue Velocity During (A Sys Med) 4.550 cm/s  MV stenosis pressure 1/2 time 36.389 ms  MV PHT 36.389 ms  MV valve area p 1/2 method 6.05 cm2  MV valve area p 1/2 method 6.05 cm2  AO Root Diameter 2.72 cm  AO Root Diameter 2.72 cm  IVC 1.530 cm  IVC 1.530 cm  ZLVPWD 2.61  ZLVIDS 0.59  ZLVIDD -1.55  ZIVSD 2.61   Result Date: 10/01/2020 .  Left Ventricle: EF: 50-55%. . Pericardium: There is a large pericardial effusion noted. . Pericardium: There is a high likelihood of cardiac tamponade. The evidence for tamponade includes: right ventricular compression. . Aortic Valve: There is severe stenosis, with peak and mean gradients of 84.701 and 47.835 mmHg.    Echocardiogram Limited WO Enhancing Agent  Result Date: 10/04/2020 .  Pericardium: There is a trivial pericardial effusion noted. . Pericardium: Pleural effusion present. . Left Ventricle: Systolic function is normal. . Left Ventricle: Wall motion is normal. No change from previous echo   Echocardiogram Limited WO Enhancing Agent  Result Date: 10/02/2020 .  Left Ventricle: Systolic function is normal. EF: 55-60%. . Left Ventricle: Wall motion is normal. . Pericardium: There is no pericardial effusion.   Echocardiogram Limited WO Enhancing Agent  Result Date: 10/02/2020 .  Left Ventricle: EF: 55-60%.   Time spent on this consult: 50 minutes   Electronically Signed: Katherine Roan, MD 10/06/2020 7:32 PM   Electronically signed by Christopher Roan, MD at 10/06/2020 10:12 PM EST     Laboratory Data:  High Sensitivity Troponin:   Recent Labs  Lab 09/19/2020 0201 09/29/2020 0357  TROPONINIHS 996* 1,053*     Chemistry Recent Labs  Lab 10/02/2020 0201  NA 128*  K 4.3  CL 96*  CO2 24  GLUCOSE 99  BUN 21  CREATININE 0.67  CALCIUM 8.8*  GFRNONAA >60  ANIONGAP 8    Hematology Recent Labs  Lab 09/20/2020 0201  WBC 9.0  RBC 3.46*  HGB 10.6*  HCT 32.8*  MCV 94.8  MCH 30.6  MCHC 32.3  RDW 15.0  PLT 311   BNP Recent Labs  Lab 10/01/2020 0201  BNP 3,201.0*     Radiology/Studies:  DG Chest Port 1 View  Result Date: 10/08/2020 CLINICAL DATA:  Chest pain EXAM: PORTABLE CHEST 1 VIEW COMPARISON:  10/06/2020 FINDINGS: Cardiac shadow is stable. Aortic calcifications are again noted. Lungs are well aerated bilaterally. Small effusions are seen bilaterally. Mild interstitial changes are noted consistent with edema slightly improved from the prior exam. No bony abnormality is noted. IMPRESSION: Mild interstitial edema slightly improved from the prior exam. Electronically Signed   By: Inez Catalina M.D.   On: 10/06/2020 02:34    Assessment and Plan:   NSTEMI/Chest  pain, troponins over 1000 but over 8000 at [facility] in past 2 weeks. Chest pain worse with laying down and recent  pericarditis-has only taken cochicine 3 doses at home. Could have underlying CAD.  Acute on chronic systolic CHF, most recent echo EF 45-50% needs IV diuresis, complains he can't urinate. Has received Lasix 40 mg IV x 1  Severe AS-patient and family request a consult with [facility] team to see if TAVR reasonable for him. Dr. Domenic Polite had a long discussion with him and the one daughter discussing options including palliative care.   Pericardial effusion with tamponade S/P pericardiocentesis 10/01/20  Pericarditis-still has pain with laying down, continue colchicine.  Weight loss over 10 yrs but more dramatic in the past 6 months.    TIMI Risk Score for Unstable Angina or Non-ST Elevation MI:   The patient's TIMI risk score is 5, which indicates a 26% risk of all cause mortality, new or recurrent myocardial infarction or need for urgent revascularization in the next 14 days.  New York Heart Association (NYHA) Functional Class NYHA Class IV    For questions or updates, please contact [facility] Please consult [email or web address] for contact info under    Signed, Christopher Barrios, PA-C 09/26/2020 9:16 AM   Attending note:  Patient seen and examined.  I reviewed extensive records including evaluation through the [facility] health system recently, also discussed situation with the patient's daughter at bedside.  I agree with above assessment by Ms. Bonnell Public PA-C.  Christopher Carr presents to the [facility] ER with recurrent chest pain and shortness of breath.  He was just recently hospitalized in the [place] system with acute on chronic combined heart failure, fluid overload with pleural effusions reportedly improved with diuresis.  LVEF approximately 45 to 50% range.  This is further complicated by severe aortic stenosis with mean gradient 48 mmHg, managed conservatively although family still wanting  further discussion about possibility of TAVR evaluation.  He underwent a pericardiocentesis earlier in November through the [place] system for treatment of a large pericardial effusion with tamponade physiology, this was only trace by recent follow-up echocardiogram.  It sounds like he has had a general functional decline over the last several months associated with weight loss.  On examination he is in no acute distress.  Systolic blood pressure ranging high 90s to 110, heart rate in the 80s in sinus rhythm.  Lungs exhibit decreased breath sounds at the bases with a few scattered crackles.  Cardiac exam reveals RRR with 3/6 systolic murmur consistent with aortic stenosis, decreased second heart sound.  PMI is indistinct.  He has trace peripheral edema.  Very hard of hearing.  Pertinent lab work includes sodium 128, potassium 4.3, creatinine 0.67, BNP 3201, high-sensitivity troponin I 996 up to 1053, hemoglobin 10.6, platelets 311.  Chest x-ray shows mild pulmonary edema.  I personally reviewed his ECG which shows sinus rhythm with LVH, possible repolarization changes although diffuse ischemia also suggested.  There is no old tracing for comparison.  Patient presents with recurrent chest pain status post recent treatment for pericarditis although unstable angina not excluded.  This is in the setting of acute on chronic combined heart failure and severe aortic stenosis.  I reviewed available records and discussed the situation with the patient and one of his daughters at bedside.  Overall, his prognosis is poor and it sounds like he has had a progressive functional decline in the last several months, worse in the last month.  He was told that he needs to "have his heart valve replaced" by his PCP.  He is clearly not a surgical candidate, has not undergone discussion regarding  TAVR, but he still could have significant CAD at baseline as well, and the etiology of his large pericardial effusion is not entirely  clear.  I discussed conservative measures and consideration of palliative care consultation.  Patient and his daughter at bedside would like for him to proceed with transfer to [facility] to at least have consultation with the heart valve team and help make this decision.  Satira Sark, M.D., F.A.C.C.

## 2020-10-14 NOTE — ED Notes (Signed)
Multiple attempts made for IV access.

## 2020-10-14 NOTE — ED Provider Notes (Addendum)
[facility] EMERGENCY DEPARTMENT Provider Note   CSN: [id number] Arrival date & time: 10/05/2020  0133     History Chief Complaint  Patient presents with  . Chest Pain    Christopher Carr is a 86 y.o. male.  Patient presents to the emergency department from home by ambulance for evaluation of chest pain.  Patient has reportedly been in and out of the hospital recently after being diagnosed with congestive heart failure.        Past Medical History:  Diagnosis Date  . CHF (congestive heart failure) ([place])   . NSTEMI (non-ST elevated myocardial infarction) [facility])     Patient Active Problem List   Diagnosis Date Noted  . NSTEMI (non-ST elevated myocardial infarction) ([place]) 09/18/2020    History reviewed. No pertinent surgical history.     History reviewed. No pertinent family history.  Social History   Tobacco Use  . Smoking status: Former Research scientist (life sciences)  . Smokeless tobacco: Never Used  Vaping Use  . Vaping Use: Never used  Substance Use Topics  . Alcohol use: Not Currently  . Drug use: Never    Home Medications Prior to Admission medications   Not on File    Allergies    Patient has no known allergies.  Review of Systems   Review of Systems  Cardiovascular: Positive for chest pain and leg swelling.  All other systems reviewed and are negative.   Physical Exam Updated Vital Signs BP (!) 110/47   Pulse 88   Temp 98.6 F (37 C) (Oral)   Resp (!) 28   Ht 5\' 4"  (1.626 m)   Wt 45.4 kg   SpO2 98%   BMI 17.16 kg/m   Physical Exam Vitals and nursing note reviewed.  Constitutional:      General: He is not in acute distress.    Appearance: Normal appearance. He is well-developed.  HENT:     Head: Normocephalic and atraumatic.     Right Ear: Hearing normal.     Left Ear: Hearing normal.     Nose: Nose normal.  Eyes:     Conjunctiva/sclera: Conjunctivae normal.     Pupils: Pupils are equal, round, and reactive to light.  Cardiovascular:     Rate and  Rhythm: Regular rhythm.     Heart sounds: S1 normal and S2 normal. Murmur heard.  No friction rub. No gallop.   Pulmonary:     Effort: Pulmonary effort is normal. No respiratory distress.     Breath sounds: Normal breath sounds.  Chest:     Chest wall: No tenderness.  Abdominal:     General: Bowel sounds are normal.     Palpations: Abdomen is soft.     Tenderness: There is no abdominal tenderness. There is no guarding or rebound. Negative signs include Murphy's sign and McBurney's sign.     Hernia: No hernia is present.  Musculoskeletal:        General: Normal range of motion.     Cervical back: Normal range of motion and neck supple.     Right lower leg: Edema present.     Left lower leg: Edema present.  Skin:    General: Skin is warm and dry.     Findings: No rash.  Neurological:     Mental Status: He is alert and oriented to person, place, and time.     GCS: GCS eye subscore is 4. GCS verbal subscore is 5. GCS motor subscore is 6.  Cranial Nerves: No cranial nerve deficit.     Sensory: No sensory deficit.     Coordination: Coordination normal.  Psychiatric:        Speech: Speech normal.        Behavior: Behavior normal.        Thought Content: Thought content normal.     ED Results / Procedures / Treatments   Labs (all labs ordered are listed, but only abnormal results are displayed) Labs Reviewed  CBC WITH DIFFERENTIAL/PLATELET - Abnormal; Notable for the following components:      Result Value   RBC 3.46 (*)    Hemoglobin 10.6 (*)    HCT 32.8 (*)    All other components within normal limits  BASIC METABOLIC PANEL - Abnormal; Notable for the following components:   Sodium 128 (*)    Chloride 96 (*)    Calcium 8.8 (*)    All other components within normal limits  BRAIN NATRIURETIC PEPTIDE - Abnormal; Notable for the following components:   B Natriuretic Peptide 3,201.0 (*)    All other components within normal limits  TROPONIN I (HIGH SENSITIVITY) - Abnormal;  Notable for the following components:   Troponin I (High Sensitivity) 996 (*)    All other components within normal limits  RESP PANEL BY RT-PCR (FLU A&B, COVID) [id number]  TROPONIN I (HIGH SENSITIVITY)    EKG EKG Interpretation  Date/Time:  Monday October 14 2020 01:50:38 EST Ventricular Rate:  86 PR Interval:    QRS Duration: 84 QT Interval:  350 QTC Calculation: 419 R Axis:   34 Text Interpretation: Sinus rhythm Probable LVH with secondary repol abnrm Confirmed by Orpah Greek [phone]) on 09/26/2020 2:31:27 AM   Radiology DG Chest Port 1 View  Result Date: 10/03/2020 CLINICAL DATA:  Chest pain EXAM: PORTABLE CHEST 1 VIEW COMPARISON:  10/06/2020 FINDINGS: Cardiac shadow is stable. Aortic calcifications are again noted. Lungs are well aerated bilaterally. Small effusions are seen bilaterally. Mild interstitial changes are noted consistent with edema slightly improved from the prior exam. No bony abnormality is noted. IMPRESSION: Mild interstitial edema slightly improved from the prior exam. Electronically Signed   By: Inez Catalina M.D.   On: 10/07/2020 02:34    Procedures Procedures (including critical care time)  Medications Ordered in ED Medications  furosemide (LASIX) injection 40 mg (40 mg Intravenous Given 10/15/2020 0343)    ED Course  I have reviewed the triage vital signs and the nursing notes.  Pertinent labs & imaging results that were available during my care of the patient were reviewed by me and considered in my medical decision making (see chart for details).    MDM Rules/Calculators/A&P                          Patient presents to the emergency department for evaluation of chest pain.  Reviewing his records reveals that he was hospitalized twice in the last couple of weeks at [facility].  Patient treated for NSTEMI, pericardial effusion with pericardial tamponade.  He had pericardiocentesis of 250 mL and he had a drain placed for several days in the  pericardium that was subsequently removed prior to discharge.  Repeat echo on November 22 showed ejection fraction of 45 to 50%.  Daughter reports that she had him brought here tonight to get him into the [facility] system.  Going back and forth to [facility] was too hard on her, as it was a 1-1/2-hour drive.  Patient continues to have significant edema of his lower extremities.  He also is known to have severe aortic stenosis. Patient experiencing chest pain today.  He does appear volume overloaded, mildly tachypneic but not significantly hypoxic.  No hypotension.  BNP is 3201.  Troponin is 996.  Reviewing records from [place], high-sensitivity troponin was over 8000 during his previous hospitalization.   Discussed with on-call cardiology.  It is recommended that the patient be transferred to [facility] on the cardiology service.  May give 40 mg of Lasix IV.  No heparinization unless second troponin comes back elevated more than 25%.  He will be placed in a progressive bed.  Awaiting bed assignment.  If still here in the morning, have cardiology round on him.  I have confirmed with both the patient and his daughter that he is DO NOT INTUBATE/DO NOT RESUSCITATE, no CPR.  CRITICAL CARE Performed by: Orpah Greek   Total critical care time: 30 minutes  Critical care time was exclusive of separately billable procedures and treating other patients.  Critical care was necessary to treat or prevent imminent or life-threatening deterioration.  Critical care was time spent personally by me on the following activities: development of treatment plan with patient and/or surrogate as well as nursing, discussions with consultants, evaluation of patient's response to treatment, examination of patient, obtaining history from patient or surrogate, ordering and performing treatments and interventions, ordering and review of laboratory studies, ordering and review of radiographic studies, pulse oximetry and  re-evaluation of patient's condition.   Final Clinical Impression(s) / ED Diagnoses Final diagnoses:  NSTEMI (non-ST elevated myocardial infarction) [facility])    Rx / DC Orders ED Discharge Orders    None       Kaya Pottenger, Gwenyth Allegra, MD 10/15/2020 8648    Orpah Greek, MD 10/06/2020 [phone]

## 2020-10-14 NOTE — ED Triage Notes (Signed)
EMS called to pt's house for c/o chest pain. Pt recently dx with CHF per daughter.

## 2020-10-14 NOTE — ED Notes (Signed)
Date and time results received: 09/24/2020 0444  Test: Troponin Critical Value: 1053  Name of Provider Notified: Pollina  Orders Received? Or Actions Taken?: n/a

## 2020-10-15 ENCOUNTER — Encounter (HOSPITAL_COMMUNITY): Payer: Self-pay | Admitting: Cardiology

## 2020-10-15 ENCOUNTER — Inpatient Hospital Stay (HOSPITAL_COMMUNITY): Payer: Medicare Other

## 2020-10-15 DIAGNOSIS — I214 Non-ST elevation (NSTEMI) myocardial infarction: Secondary | ICD-10-CM | POA: Diagnosis not present

## 2020-10-15 DIAGNOSIS — I350 Nonrheumatic aortic (valve) stenosis: Secondary | ICD-10-CM

## 2020-10-15 LAB — ECHOCARDIOGRAM COMPLETE
AR max vel: 0.48 cm2
AV Area VTI: 0.55 cm2
AV Area mean vel: 0.46 cm2
AV Mean grad: 26 mmHg
AV Peak grad: 46.1 mmHg
Ao pk vel: 3.39 m/s
Area-P 1/2: 4.41 cm2
Height: 64 in
MV M vel: 4.82 m/s
MV Peak grad: 92.9 mmHg
S' Lateral: 3.01 cm
Weight: 1600 oz

## 2020-10-15 LAB — CBC
HCT: 36.5 % — ABNORMAL LOW (ref 39.0–52.0)
Hemoglobin: 12 g/dL — ABNORMAL LOW (ref 13.0–17.0)
MCH: 31 pg (ref 26.0–34.0)
MCHC: 32.9 g/dL (ref 30.0–36.0)
MCV: 94.3 fL (ref 80.0–100.0)
Platelets: 321 10*3/uL (ref 150–400)
RBC: 3.87 MIL/uL — ABNORMAL LOW (ref 4.22–5.81)
RDW: 15 % (ref 11.5–15.5)
WBC: 10.5 10*3/uL (ref 4.0–10.5)
nRBC: 0 % (ref 0.0–0.2)

## 2020-10-15 LAB — HEPARIN LEVEL (UNFRACTIONATED)
Heparin Unfractionated: 0.26 IU/mL — ABNORMAL LOW (ref 0.30–0.70)
Heparin Unfractionated: 0.46 IU/mL (ref 0.30–0.70)

## 2020-10-15 LAB — BASIC METABOLIC PANEL
Anion gap: 12 (ref 5–15)
BUN: 21 mg/dL (ref 8–23)
CO2: 24 mmol/L (ref 22–32)
Calcium: 8.7 mg/dL — ABNORMAL LOW (ref 8.9–10.3)
Chloride: 94 mmol/L — ABNORMAL LOW (ref 98–111)
Creatinine, Ser: 0.73 mg/dL (ref 0.61–1.24)
GFR, Estimated: >60 mL/min (ref >60)
Glucose, Bld: 86 mg/dL (ref 70–99)
Potassium: 3.7 mmol/L (ref 3.5–5.1)
Sodium: 130 mmol/L — ABNORMAL LOW (ref 135–145)

## 2020-10-15 LAB — TROPONIN I (HIGH SENSITIVITY)
Troponin I (High Sensitivity): 1017 ng/L (ref <18)
Troponin I (High Sensitivity): 991 ng/L (ref <18)

## 2020-10-15 MED ORDER — HEPARIN BOLUS VIA INFUSION
650.0000 [IU] | Freq: Once | INTRAVENOUS | Status: AC
  Administered 2020-10-15: 650 [IU] via INTRAVENOUS

## 2020-10-15 MED ORDER — HEPARIN (PORCINE) 25000 UT/250ML-% IV SOLN
600.0000 [IU]/h | INTRAVENOUS | Status: DC
  Administered 2020-10-15: 500 [IU]/h via INTRAVENOUS
  Administered 2020-10-15 – 2020-10-16 (×2): 600 [IU]/h via INTRAVENOUS
  Filled 2020-10-15 (×2): qty 250

## 2020-10-15 MED ORDER — HEPARIN BOLUS VIA INFUSION
2000.0000 [IU] | Freq: Once | INTRAVENOUS | Status: AC
  Administered 2020-10-15: 2000 [IU] via INTRAVENOUS

## 2020-10-15 NOTE — Plan of Care (Signed)

## 2020-10-15 NOTE — ED Notes (Signed)
Ultrasound at bedside at this time for Echo.

## 2020-10-15 NOTE — ED Notes (Signed)
Pt family at bedside and given update.

## 2020-10-15 NOTE — Progress Notes (Signed)
ANTICOAGULATION CONSULT NOTE -   Pharmacy Consult for heparin Indication: chest pain/ACS  Allergies  Allergen Reactions  . Fluticasone Other (See Comments) and Rash    Can't use because pt. Has glaucoma Can't use because pt. Has glaucoma   . Ciprofloxacin Other (See Comments)    Other reaction(s): GI Upset (intolerance)     Patient Measurements: Height: 5\' 5"  (165.1 cm) Weight: 41.4 kg (91 lb 4.3 oz) IBW/kg (Calculated) : 61.5  Vital Signs: Temp: 97.9 F (36.6 C) (11/30 2112) Temp Source: Oral (11/30 2112) BP: 100/57 (11/30 2112) Pulse Rate: 89 (11/30 2112)  Labs: Recent Labs    10/11/2020 0201 09/29/2020 0201 09/25/2020 0357 10/15/20 0009 10/15/20 0240 10/15/20 1025 10/15/20 2125  HGB 10.6*  --   --   --   --  12.0*  --   HCT 32.8*  --   --   --   --  36.5*  --   PLT 311  --   --   --   --  321  --   HEPARINUNFRC  --   --   --   --   --  0.26* 0.46  CREATININE 0.67  --   --  0.73  --   --   --   TROPONINIHS 996*   < > 1,053* 1,017* 991*  --   --    < > = values in this interval not displayed.    Estimated Creatinine Clearance: 34.5 mL/min (by C-G formula based on SCr of 0.73 mg/dL).   Medical History: Past Medical History:  Diagnosis Date  . Cardiomyopathy ([place])   . Hearing loss   . NSTEMI (non-ST elevated myocardial infarction) ([place])   . Pericardial effusion    Pericardiocentesis 09/2020 - [facility]  . Prostate cancer ([place])   . Severe aortic stenosis     Assessment: 86yo male c/o CP and CHF with multiple recent hospital admissions at outside health systems (severe AS, pericardial effusion, tamponade, pericarditis, NSTEMI, pleural effusions), to begin heparin for ACS, elevated troponin (1053 here, trending up, had been >8000 at OSH recently).  HL 0.46- therapeutic  Goal of Therapy:  Heparin level 0.3-0.7 units/ml Monitor platelets by anticoagulation protocol: Yes   Plan:  Continue heparin infusion at 600 units/hr Monitor heparin level and CBC  daily Monitor  s/s of bleeding. Alanda Slim, PharmD, [facility] Clinical Pharmacist Please see AMION for all Pharmacists' Contact Phone Numbers 10/15/2020, 10:06 PM

## 2020-10-15 NOTE — Progress Notes (Signed)
ANTICOAGULATION CONSULT NOTE -   Pharmacy Consult for heparin Indication: chest pain/ACS  Allergies  Allergen Reactions  . Fluticasone Other (See Comments) and Rash    Can't use because pt. Has glaucoma Can't use because pt. Has glaucoma   . Ciprofloxacin Other (See Comments)    Other reaction(s): GI Upset (intolerance)     Patient Measurements: Height: 5\' 4"  (162.6 cm) Weight: 45.4 kg (100 lb) IBW/kg (Calculated) : 59.2  Vital Signs: BP: 114/49 (11/30 1123) Pulse Rate: 82 (11/30 1000)  Labs: Recent Labs    09/18/2020 0201 10/15/2020 0201 09/22/2020 0357 10/15/20 0009 10/15/20 0240 10/15/20 1025  HGB 10.6*  --   --   --   --  12.0*  HCT 32.8*  --   --   --   --  36.5*  PLT 311  --   --   --   --  321  HEPARINUNFRC  --   --   --   --   --  0.26*  CREATININE 0.67  --   --  0.73  --   --   TROPONINIHS 996*   < > 1,053* 1,017* 991*  --    < > = values in this interval not displayed.    Estimated Creatinine Clearance: 37.8 mL/min (by C-G formula based on SCr of 0.73 mg/dL).   Medical History: Past Medical History:  Diagnosis Date  . Cardiomyopathy ([place])   . Hearing loss   . NSTEMI (non-ST elevated myocardial infarction) ([place])   . Pericardial effusion    Pericardiocentesis 09/2020 - [facility]  . Prostate cancer ([place])   . Severe aortic stenosis     Assessment: 86yo male c/o CP and CHF with multiple recent hospital admissions at outside health systems (severe AS, pericardial effusion, tamponade, pericarditis, NSTEMI, pleural effusions), to begin heparin for ACS, elevated troponin (1053 here, trending up, had been >8000 at OSH recently).  HL 0.26- slightly subtherapeutic  Goal of Therapy:  Heparin level 0.3-0.7 units/ml Monitor platelets by anticoagulation protocol: Yes   Plan:  Rebolus heparin 650 units x 1. Increase heparin infusion to 600 units/hr Monitor heparin level in 8 hours and daily Monitor H&H and s/s of bleeding. Margot Ables, PharmD Clinical  Pharmacist 10/15/2020 11:29 AM

## 2020-10-15 NOTE — Progress Notes (Signed)
*  PRELIMINARY RESULTS* Echocardiogram 2D Echocardiogram has been performed.  Christopher Carr 10/15/2020, 12:26 PM

## 2020-10-15 NOTE — Consult Note (Addendum)
[facility] VALVE TEAM  Cardiology Consultation:   Patient ID: Christopher Carr MRN: [id number]; DOB: 1926-03-26  Admit date: 09/19/2020 Date of Consult: 10/15/2020  Primary Care Provider: Caryl Bis, MD [facility] Cardiologist: Rozann Lesches, MD  [facility] Electrophysiologist:  None    Patient Profile:   Christopher Carr is a 86 y.o. male with a hx of severe AS, chronic systolic/diastolic CHF (EF 09-47%), pericarditis, pericardial effusion wit tamponade s/p recent pericardiocentesis, protein calorie malnutrition, failure to thrive, who is being seen today for the evaluation of severe AS at the request of Dr. Domenic Polite.  History of Present Illness:   Christopher Carr previously lived independently in the [place].  His wife passed 14 years ago.  He has 3 daughters that are very involved in his care.  Since his recent admissions he has been living with his daughter who can no longer take care of him.  He needs help with all ADLs.  The patient spends most of his day watching TV or "staring at the wall wall."  He occasionally still drives to go to appointments, but very rarely.  He is very hard of hearing.  He has been losing significant amount of weight over the past year and is down to 90 lbs.   He was recently admitted for [facility] with a cardiac tamponade s/p pericardiocentesis on 10/01/2020 with 250 cc's of fluid removed. Echo 10/01/20 showed LVEF 50-55%, large pericardial effusion with evidence of tamponade, severe AS peak gradient 84.7, mean 47.8 mm hg. He was treated for presumptive viral pericarditis and subsequently discharged. He then presented to [facility] with complaints of chest pain. Troponin >8000 consistent with NSTEMI. He was subsequently transferred to [facility]. Repeat limited echo showed EF 45-50%, trivial pericardial effusion, and large pleural effusion. He was treated with IV furosemide with  subsequent improvement. He was not felt to be a candidate for invasive procedures including cardiac cath.   He presented to [facility] on 09/21/2020 with recurrent chest pain. BNP 3201, troponin 996, CXR with mild interstitial edema improved from previous exam. He was started on IV heparin and IV lasix. Repeat echo 10/15/20 showed EF 35-40% with regional wall motion abnormalities, G2DD,moderately elevated pulmonary artery pressure (RVSP 52.3 mm hg), trivial pericardial effusion, mild MR, severe LFLG AS with a mean gradient of 26 mm hg, peak gradient 46.1 mm hg, DVI 0.18, SVI 29, mild-moderate AR.  He was seen in consultation by Dr. Domenic Polite who felt his prognosis was extremely poor but family was insistent on TAVR eval so patient was transferred to [facility] for structural heart consultation.   He is seen today with his daughter at bedside.  The daughter provides most of the history as the patient is very hard of hearing.  The patient has had chest pain over the past month.  He has constant shortness of breath which has been ongoing for quite some time now.  He does very little physical activity.  He has had lower extremity edema, orthopnea and PND.  He is feeling much better with IV diuresis.  The patient denies dizziness or syncope, although the patient complains that he cannot see well sometimes with blurred vision.    Past Medical History:  Diagnosis Date  . Cardiomyopathy ([place])   . Hearing loss   . NSTEMI (non-ST elevated myocardial infarction) ([place])   . Pericardial effusion    Pericardiocentesis 09/2020 - [facility]  . Prostate cancer ([place])   .  Severe aortic stenosis     Past Surgical History:  Procedure Laterality Date  . HIATAL HERNIA REPAIR       Home Medications:  Prior to Admission medications   Medication Sig Start Date End Date Taking? Authorizing Provider  acetaminophen (TYLENOL) 325 MG tablet Take 325 mg by mouth every 6 (six) hours as needed for mild pain.    Yes [provider]    ALPRAZolam (XANAX) 0.25 MG tablet Take 0.25 mg by mouth in the morning, at noon, and at bedtime. 09/09/20  Yes [provider]  aspirin 81 MG EC tablet Take 81 mg by mouth daily.  10/12/20 10/12/21 Yes [provider]  Cholecalciferol 50 MCG (2000 UT) TABS Take 2,000 Units by mouth daily.    Yes [provider]  colchicine 0.6 MG tablet Take 0.6 mg by mouth daily.  10/05/20 10/19/20 Yes [provider]  loratadine (CLARITIN) 10 MG tablet Take 10 mg by mouth daily.   Yes [provider]  Multiple Vitamins-Minerals (PRESERVISION AREDS) CAPS Take 1 capsule by mouth every morning.   Yes [provider]  omeprazole (PRILOSEC) 20 MG capsule Take 20 mg by mouth daily.   Yes [provider]  ondansetron (ZOFRAN) 8 MG tablet Take 8 mg by mouth every 8 (eight) hours as needed for nausea or vomiting.    Yes [provider]  polyethylene glycol powder (GLYCOLAX/MIRALAX) 17 GM/SCOOP powder Take 0.5 Containers by mouth daily as needed for mild constipation.    Yes [provider]  tamsulosin (FLOMAX) 0.4 MG CAPS capsule Take 0.4 mg by mouth at bedtime.  07/28/14  Yes [provider]  timolol (TIMOPTIC) 0.5 % ophthalmic solution Place 1 drop into both eyes daily.  09/17/20  Yes [provider]    Inpatient Medications: Scheduled Meds: . aspirin EC  81 mg Oral Daily  . colchicine  0.6 mg Oral BID  . furosemide  40 mg Intravenous BID  . tamsulosin  0.4 mg Oral QPC supper  . timolol  1 drop Both Eyes Daily   Continuous Infusions: . heparin 600 Units/hr (10/15/20 1926)   PRN Meds:   Allergies:    Allergies  Allergen Reactions  . Fluticasone Other (See Comments) and Rash    Can't use because pt. Has glaucoma Can't use because pt. Has glaucoma   . Ciprofloxacin Other (See Comments)    Other reaction(s): GI Upset (intolerance)     Social History:   Social History   Socioeconomic History  . Marital  status: Married    Spouse name: Not on file  . Number of children: Not on file  . Years of education: Not on file  . Highest education level: Not on file  Occupational History  . Not on file  Tobacco Use  . Smoking status: Former Smoker    Types: Cigarettes  . Smokeless tobacco: Never Used  Vaping Use  . Vaping Use: Never used  Substance and Sexual Activity  . Alcohol use: Not Currently  . Drug use: Never  . Sexual activity: Not on file  Other Topics Concern  . Not on file  Social History Narrative  . Not on file   Social Determinants of Health   Financial Resource Strain:   . Difficulty of Paying Living Expenses: Not on file  Food Insecurity:   . Worried About Charity fundraiser in the Last Year: Not on file  . Ran Out of Food in the Last Year: Not on file  Transportation Needs:   . Film/video editor (Medical): Not on file  . Lack of Transportation (Non-Medical): Not on file  Physical Activity:   . Days of Exercise per Week: Not on file  . Minutes of Exercise per Session: Not on file  Stress:   . Feeling of Stress : Not on file  Social Connections:   . Frequency of Communication with Friends and Family: Not on file  . Frequency of Social Gatherings with Friends and Family: Not on file  . Attends Religious Services: Not on file  . Active Member of Clubs or Organizations: Not on file  . Attends Archivist Meetings: Not on file  . Marital Status: Not on file  Intimate Partner Violence:   . Fear of Current or Ex-Partner: Not on file  . Emotionally Abused: Not on file  . Physically Abused: Not on file  . Sexually Abused: Not on file    Family History:   Family History  Family history unknown: Yes     ROS:  Please see the history of present illness.  All other ROS reviewed and negative.     Physical Exam/Data:   Vitals:   10/15/20 1828 10/15/20 1900 10/15/20 2000 10/15/20 2112  BP: (!) 118/54 (!) 122/51 (!) 113/54 (!) 100/57  Pulse: 83 76 79  89  Resp: (!) 24 18 (!) 25 17  Temp:    97.9 F (36.6 C)  TempSrc:    Oral  SpO2: 96% 99% 99% 100%  Weight:    41.4 kg  Height:    5\' 5"  (1.651 m)    Intake/Output Summary (Last 24 hours) at 10/15/2020 2126 Last data filed at 10/15/2020 1126 Gross per 24 hour  Intake --  Output 1600 ml  Net -1600 ml   Last 3 Weights 10/15/2020 10/13/2020  Weight (lbs) 91 lb 4.3 oz 100 lb  Weight (kg) 41.4 kg 45.36 kg     Body mass index is 15.19 kg/m.  General: Cachectic white male HEENT: normal Lymph: no adenopathy Neck: no JVD Endocrine:  No thryomegaly Vascular: No carotid bruits; FA pulses 2+ bilaterally without bruits  Cardiac:  normal S1, S2; RRR; 3 out of 6 harsh systolic ejection murmur Lungs:  clear to auscultation bilaterally, no wheezing, rhonchi or rales  Abd: soft, nontender, no hepatomegaly  Ext: 1+ bilateral lower extremity edema Musculoskeletal:  No deformities, BUE and BLE strength normal and equal Skin: warm and dry  Neuro:  CNs 2-12 intact, no focal abnormalities noted Psych:  Normal affect   EKG:  The EKG was personally reviewed and demonstrates:  Sinus with LVH and repol abnormalities Telemetry:  Telemetry was personally reviewed and demonstrates: Sinus  Relevant CV Studies: Echo 10/15/20 IMPRESSIONS  1. Left ventricular ejection fraction, by estimation, is approximately 35  to 40%. The left ventricle has moderately decreased function. The left  ventricle demonstrates regional wall motion abnormalities (see scoring  diagram/findings for description).  There is mild left ventricular hypertrophy. Left ventricular diastolic  parameters are consistent with Grade II diastolic dysfunction  (pseudonormalization).  2. Right ventricular systolic function is normal. The right ventricular  size is normal. There is moderately elevated pulmonary artery systolic  pressure. The estimated right ventricular systolic pressure is 68.1 mmHg.  3. Left atrial size was  moderately dilated.  4. There is a trivial pericardial effusion posterior to the left  ventricle.  5. The mitral valve is abnormal. Mild mitral valve regurgitation.  6. The aortic valve is tricuspid. There is  severe calcifcation of the  aortic valve. Aortic valve regurgitation is mild to moderate and  peripheral rather than central. Aortic valve mean gradient measures 26.0  mmHg. Aortic valve Vmax measures 3.39 m/s.  Dimentionless index is 0.18. Consistent with severe, low gradient aortic  stenosis.  7. The inferior vena cava is normal in size with greater than 50%  respiratory variability, suggesting right atrial pressure of 3 mmHg.   FINDINGS  Left Ventricle: Left ventricular ejection fraction, by estimation, is 35  to 40%. The left ventricle has moderately decreased function. The left  ventricle demonstrates regional wall motion abnormalities. The left  ventricular internal cavity size was  normal in size. There is mild left ventricular hypertrophy. Left  ventricular diastolic parameters are consistent with Grade II diastolic  dysfunction (pseudonormalization).     LV Wall Scoring:  The mid and distal anterior septum, mid inferoseptal segment, apical  anterior  segment, apical inferior segment, and apex are hypokinetic. The anterior  wall, entire lateral wall, inferior wall, basal anteroseptal segment, and  basal inferoseptal segment are normal.   Right Ventricle: The right ventricular size is normal. No increase in  right ventricular wall thickness. Right ventricular systolic function is  normal. There is moderately elevated pulmonary artery systolic pressure.  The tricuspid regurgitant velocity is  3.51 m/s, and with an assumed right atrial pressure of 3 mmHg, the  estimated right ventricular systolic pressure is 34.2 mmHg.   Left Atrium: Left atrial size was moderately dilated.   Right Atrium: Right atrial size was normal in size.   Pericardium: Trivial  pericardial effusion is present. The pericardial  effusion is posterior to the left ventricle.   Mitral Valve: The mitral valve is abnormal. There is mild thickening of  the mitral valve leaflet(s). There is mild calcification of the mitral  valve leaflet(s). Mild mitral valve regurgitation.   Tricuspid Valve: The tricuspid valve is grossly normal. Tricuspid valve  regurgitation is mild.   Aortic Valve: The aortic valve is tricuspid. There is severe calcifcation  of the aortic valve. There is moderate aortic valve annular calcification.  Aortic valve regurgitation is mild to moderate. Severe aortic stenosis is  present. Aortic valve mean  gradient measures 26.0 mmHg. Aortic valve peak gradient measures 46.1  mmHg. Aortic valve area, by VTI measures 0.55 cm.   Pulmonic Valve: The pulmonic valve was not well visualized. Pulmonic valve  regurgitation is trivial.   Aorta: The aortic root is normal in size and structure.   Venous: The inferior vena cava is normal in size with greater than 50%  respiratory variability, suggesting right atrial pressure of 3 mmHg.   IAS/Shunts: No atrial level shunt detected by color flow Doppler.     LEFT VENTRICLE  PLAX 2D  LVIDd:     4.05 cm Diastology  LVIDs:     3.01 cm LV e' medial:  3.08 cm/s  LV PW:     1.12 cm LV E/e' medial: 33.4  LV IVS:    1.04 cm LV e' lateral:  6.38 cm/s  LVOT diam:   2.00 cm LV E/e' lateral: 16.1  LV SV:     43  LV SV Index:  29  LVOT Area:   3.14 cm     RIGHT VENTRICLE  RV S prime:   12.60 cm/s  TAPSE (M-mode): 1.7 cm   LEFT ATRIUM       Index    RIGHT ATRIUM      Index  LA diam:  3.20 cm 2.20 cm/m RA Area:   11.20 cm  LA Vol (A2C):  44.5 ml 30.54 ml/m RA Volume:  26.30 ml 18.05 ml/m  LA Vol (A4C):  63.4 ml 43.51 ml/m  LA Biplane Vol: 58.4 ml 40.08 ml/m  AORTIC VALVE  AV Area (Vmax):  0.48 cm  AV Area (Vmean):  0.46 cm  AV Area  (VTI):   0.55 cm  AV Vmax:      339.33 cm/s  AV Vmean:     236.000 cm/s  AV VTI:      0.770 m  AV Peak Grad:   46.1 mmHg  AV Mean Grad:   26.0 mmHg  LVOT Vmax:     51.70 cm/s  LVOT Vmean:    34.800 cm/s  LVOT VTI:     0.136 m  LVOT/AV VTI ratio: 0.18    AORTA  Ao Root diam: 2.50 cm   MITRAL VALVE        TRICUSPID VALVE  MV Area (PHT): 4.41 cm   TR Peak grad:  49.3 mmHg  MV Decel Time: 172 msec   TR Vmax:    351.00 cm/s  MR Peak grad: 92.9 mmHg  MR Mean grad: 63.0 mmHg   SHUNTS  MR Vmax:   482.00 cm/s  Systemic VTI: 0.14 m  MR Vmean:   376.0 cm/s  Systemic Diam: 2.00 cm  MV E velocity: 103.00 cm/s  MV A velocity: 48.00 cm/s  MV E/A ratio: 2.15   Laboratory Data:  High Sensitivity Troponin:   Recent Labs  Lab 10/01/2020 0201 10/10/2020 0357 10/15/20 0009 10/15/20 0240  TROPONINIHS 996* 1,053* 1,017* 991*     Chemistry Recent Labs  Lab 09/29/2020 0201 10/15/20 0009  NA 128* 130*  K 4.3 3.7  CL 96* 94*  CO2 24 24  GLUCOSE 99 86  BUN 21 21  CREATININE 0.67 0.73  CALCIUM 8.8* 8.7*  GFRNONAA >60 >60  ANIONGAP 8 12    No results for input(s): PROT, ALBUMIN, AST, ALT, ALKPHOS, BILITOT in the last 168 hours. Hematology Recent Labs  Lab 10/09/2020 0201 10/15/20 1025  WBC 9.0 10.5  RBC 3.46* 3.87*  HGB 10.6* 12.0*  HCT 32.8* 36.5*  MCV 94.8 94.3  MCH 30.6 31.0  MCHC 32.3 32.9  RDW 15.0 15.0  PLT 311 321   BNP Recent Labs  Lab 10/03/2020 0201  BNP 3,201.0*    DDimer No results for input(s): DDIMER in the last 168 hours.   Radiology/Studies:  DG Chest Port 1 View  Result Date: 09/26/2020 CLINICAL DATA:  Chest pain EXAM: PORTABLE CHEST 1 VIEW COMPARISON:  10/06/2020 FINDINGS: Cardiac shadow is stable. Aortic calcifications are again noted. Lungs are well aerated bilaterally. Small effusions are seen bilaterally. Mild interstitial changes are noted consistent with edema slightly improved from  the prior exam. No bony abnormality is noted. IMPRESSION: Mild interstitial edema slightly improved from the prior exam. Electronically Signed   By: Inez Catalina M.D.   On: 10/10/2020 02:34   ECHOCARDIOGRAM COMPLETE  Result Date: 10/15/2020    ECHOCARDIOGRAM REPORT   Patient Name:   Christopher Carr Date of Exam: 10/15/2020 Medical Rec #:  [id number]     Height:       64.0 in Accession #:    [id number]    Weight:       100.0 lb Date of Birth:  Aug 02, 1926     BSA:          1.457 m Patient Age:  89 years      BP:           99/54 mmHg Patient Gender: M             HR:           70 bpm. Exam Location:  [facility] Procedure: 2D Echo Indications:    Aortic Stenosis 424.1 / 135.0  History:        Patient has prior history of Echocardiogram examinations, most                 recent 10/07/2020. Previous Myocardial Infarction, Aortic Valve                 Disease, Signs/Symptoms:Chest Pain; Risk Factors:Former Smoker.                 Pericardial Efffusion, Prostate Cancer.  Sonographer:    Leavy Cella RDCS (AE) Referring Phys: [place]  1. Left ventricular ejection fraction, by estimation, is approximately 35 to 40%. The left ventricle has moderately decreased function. The left ventricle demonstrates regional wall motion abnormalities (see scoring diagram/findings for description). There is mild left ventricular hypertrophy. Left ventricular diastolic parameters are consistent with Grade II diastolic dysfunction (pseudonormalization).  2. Right ventricular systolic function is normal. The right ventricular size is normal. There is moderately elevated pulmonary artery systolic pressure. The estimated right ventricular systolic pressure is 94.7 mmHg.  3. Left atrial size was moderately dilated.  4. There is a trivial pericardial effusion posterior to the left ventricle.  5. The mitral valve is abnormal. Mild mitral valve regurgitation.  6. The aortic valve is tricuspid. There is severe  calcifcation of the aortic valve. Aortic valve regurgitation is mild to moderate and peripheral rather than central. Aortic valve mean gradient measures 26.0 mmHg. Aortic valve Vmax measures 3.39 m/s. Dimentionless index is 0.18. Consistent with severe, low gradient aortic stenosis.  7. The inferior vena cava is normal in size with greater than 50% respiratory variability, suggesting right atrial pressure of 3 mmHg. FINDINGS  Left Ventricle: Left ventricular ejection fraction, by estimation, is 35 to 40%. The left ventricle has moderately decreased function. The left ventricle demonstrates regional wall motion abnormalities. The left ventricular internal cavity size was normal in size. There is mild left ventricular hypertrophy. Left ventricular diastolic parameters are consistent with Grade II diastolic dysfunction (pseudonormalization).  LV Wall Scoring: The mid and distal anterior septum, mid inferoseptal segment, apical anterior segment, apical inferior segment, and apex are hypokinetic. The anterior wall, entire lateral wall, inferior wall, basal anteroseptal segment, and basal inferoseptal segment are normal. Right Ventricle: The right ventricular size is normal. No increase in right ventricular wall thickness. Right ventricular systolic function is normal. There is moderately elevated pulmonary artery systolic pressure. The tricuspid regurgitant velocity is 3.51 m/s, and with an assumed right atrial pressure of 3 mmHg, the estimated right ventricular systolic pressure is 09.6 mmHg. Left Atrium: Left atrial size was moderately dilated. Right Atrium: Right atrial size was normal in size. Pericardium: Trivial pericardial effusion is present. The pericardial effusion is posterior to the left ventricle. Mitral Valve: The mitral valve is abnormal. There is mild thickening of the mitral valve leaflet(s). There is mild calcification of the mitral valve leaflet(s). Mild mitral valve regurgitation. Tricuspid Valve: The  tricuspid valve is grossly normal. Tricuspid valve regurgitation is mild. Aortic Valve: The aortic valve is tricuspid. There is severe calcifcation of the aortic valve. There is moderate aortic valve annular  calcification. Aortic valve regurgitation is mild to moderate. Severe aortic stenosis is present. Aortic valve mean gradient measures 26.0 mmHg. Aortic valve peak gradient measures 46.1 mmHg. Aortic valve area, by VTI measures 0.55 cm. Pulmonic Valve: The pulmonic valve was not well visualized. Pulmonic valve regurgitation is trivial. Aorta: The aortic root is normal in size and structure. Venous: The inferior vena cava is normal in size with greater than 50% respiratory variability, suggesting right atrial pressure of 3 mmHg. IAS/Shunts: No atrial level shunt detected by color flow Doppler.  LEFT VENTRICLE PLAX 2D LVIDd:         4.05 cm  Diastology LVIDs:         3.01 cm  LV e' medial:    3.08 cm/s LV PW:         1.12 cm  LV E/e' medial:  33.4 LV IVS:        1.04 cm  LV e' lateral:   6.38 cm/s LVOT diam:     2.00 cm  LV E/e' lateral: 16.1 LV SV:         43 LV SV Index:   29 LVOT Area:     3.14 cm  RIGHT VENTRICLE RV S prime:     12.60 cm/s TAPSE (M-mode): 1.7 cm LEFT ATRIUM             Index       RIGHT ATRIUM           Index LA diam:        3.20 cm 2.20 cm/m  RA Area:     11.20 cm LA Vol (A2C):   44.5 ml 30.54 ml/m RA Volume:   26.30 ml  18.05 ml/m LA Vol (A4C):   63.4 ml 43.51 ml/m LA Biplane Vol: 58.4 ml 40.08 ml/m  AORTIC VALVE AV Area (Vmax):    0.48 cm AV Area (Vmean):   0.46 cm AV Area (VTI):     0.55 cm AV Vmax:           339.33 cm/s AV Vmean:          236.000 cm/s AV VTI:            0.770 m AV Peak Grad:      46.1 mmHg AV Mean Grad:      26.0 mmHg LVOT Vmax:         51.70 cm/s LVOT Vmean:        34.800 cm/s LVOT VTI:          0.136 m LVOT/AV VTI ratio: 0.18  AORTA Ao Root diam: 2.50 cm MITRAL VALVE                TRICUSPID VALVE MV Area (PHT): 4.41 cm     TR Peak grad:   49.3 mmHg MV Decel  Time: 172 msec     TR Vmax:        351.00 cm/s MR Peak grad: 92.9 mmHg MR Mean grad: 63.0 mmHg     SHUNTS MR Vmax:      482.00 cm/s   Systemic VTI:  0.14 m MR Vmean:     376.0 cm/s    Systemic Diam: 2.00 cm MV E velocity: 103.00 cm/s MV A velocity: 48.00 cm/s MV E/A ratio:  2.15 Rozann Lesches MD Electronically signed by Rozann Lesches MD Signature Date/Time: 10/15/2020/4:37:58 PM    Final      STS Risk Calculator: Procedure: AV Replacement  Risk of Mortality: 6.477% Renal Failure: 1.645% Permanent  Stroke: 2.302% Prolonged Ventilation: 12.262% DSW Infection: 0.049% Reoperation: 6.614% Morbidity or Mortality: 19.057% Short Length of Stay: 18.466% Long Length of Stay: 11.543%  Assessment and Plan:   Christopher Carr is a 86 y.o. male with symptoms of severe, stage D2 LFLG aortic stenosis with NYHA Class IV symptoms who is currently admitted for NSTEMI and acute CHF. I have reviewed the patient's recent echocardiogram which is notable for reduced LV systolic function (EF 28-31%) and severe aortic stenosis with peak gradient of 46.1 and mean transvalvular gradient of 26. The patient's dimensionless index is 0.18 and calculated aortic valve area is 0.48 cm and mild-mod AI.    I have reviewed the natural history of aortic stenosis with the patient. We have discussed the limitations of medical therapy and the poor prognosis associated with symptomatic aortic stenosis. We have reviewed potential treatment options, including palliative medical therapy, conventional surgical aortic valve replacement, and transcatheter aortic valve replacement. We discussed treatment options in the context of this patient's specific comorbid medical conditions.    The patient's predicted risk of mortality with conventional aortic valve replacement is 6.477% primarily based on advanced age, chronic systolic/diastolic CHF (EF 51-76%), and NSTEMI. Other significant comorbid conditions include limited functional  capacity, failure to thrive with ongoing weight loss and recent pericardiocentesis for cardiac tamponade.    I had a long discussion with his daughter today who is in agreement that the patient would not want to proceed with the extensive TAVR work-up or proceed with transcatheter aortic valve replacement.  We feel the risks outweigh the benefit.  With his failure to thrive, ongoing weight loss, cachexia, limited functional status, and moderate to severe LV dysfunction, we do not think that TAVR would significantly benefit him or change his trajectory.  We would recommend consulting palliative care and social work for skilled nursing facility placement.  The patient requires a lot of caregiving and the daughter can no longer accommodate this as she has her own medical issues.  Dr. Angelena Form to follow      TIMI Risk Score for Unstable Angina or Non-ST Elevation MI:   The patient's TIMI risk score is 3, which indicates a 13% risk of all cause mortality, new or recurrent myocardial infarction or need for urgent revascularization in the next 14 days.  New York Heart Association (NYHA) Functional Class NYHA Class IV     For questions or updates, please contact [facility] Please consult [email or web address] for contact info under    Signed, Angelena Form, PA-C  10/15/2020 9:26 PM   I have personally seen and examined this patient. I agree with the assessment and plan as outlined above.  Mr. Valliant clearly has severe aortic stenosis in the setting of reduced LV systolic function. He has had recent failure to thrive with profound weight loss. He reports improvement in his dyspnea following diuresis.   I have reviewed his echo images and labs today.  I have reviewed available notes.   My exam:  General: Cachectic male in NAD HEENT: OP clear, mucus membranes moist  SKIN: warm, dry. No rashes. Neuro: No focal deficits  Musculoskeletal: Muscle strength 5/5 all ext  Psychiatric: Mood and affect  normal  Neck: No JVD Lungs:Clear bilaterally, no wheezes, rhonci, crackles Cardiovascular: Regular rate and rhythm. Harsh systolic murmur.  Abdomen:Soft. Bowel sounds present. Non-tender.  Extremities: 1+ bilateral lower extremity edema.   Plan: Severe low flow, low gradient aortic stenosis: He has severe, stage D2 aortic valve stenosis. He is admitted with  elevated troponin consistent with NSTEMI and acute on chronic CHF. He is not felt to be a candidate for cardiac cath. I have personally reviewed the echo images. The aortic valve is thickened, calcified with limited leaflet mobility.I have reviewed the natural history of aortic stenosis with the patient and their family members  who are present today. We have discussed the limitations of medical therapy and the poor prognosis associated with symptomatic aortic stenosis. We have reviewed potential treatment options, including palliative medical therapy, conventional surgical aortic valve replacement, and transcatheter aortic valve replacement. We discussed treatment options in the context of the patient's specific comorbid medical conditions.   Given his advanced age, failure to thrive, weight loss with cachexia and cardiomyopathy, he is not felt to be a candidate for further invasive cardiac procedures. I do not think that he is a candidate for TAVR. I have discussed this with his daughter who is in agreement. Will need consideration for placement in a nursing facility following discharge.   Lauree Chandler 10/16/2020 12:29 PM  .

## 2020-10-15 NOTE — Progress Notes (Signed)
ANTICOAGULATION CONSULT NOTE - Initial Consult  Pharmacy Consult for heparin Indication: chest pain/ACS  Allergies  Allergen Reactions  . Fluticasone Other (See Comments) and Rash    Can't use because pt. Has glaucoma Can't use because pt. Has glaucoma   . Ciprofloxacin Other (See Comments)    Other reaction(s): GI Upset (intolerance)     Patient Measurements: Height: 5\' 4"  (162.6 cm) Weight: 45.4 kg (100 lb) IBW/kg (Calculated) : 59.2  Vital Signs: BP: 101/53 (11/29 2330) Pulse Rate: 77 (11/29 2330)  Labs: Recent Labs    10/02/2020 0201 09/24/2020 0357  HGB 10.6*  --   HCT 32.8*  --   PLT 311  --   CREATININE 0.67  --   TROPONINIHS 996* 1,053*    Estimated Creatinine Clearance: 37.8 mL/min (by C-G formula based on SCr of 0.67 mg/dL).   Medical History: Past Medical History:  Diagnosis Date  . Cardiomyopathy ([place])   . Hearing loss   . NSTEMI (non-ST elevated myocardial infarction) ([place])   . Pericardial effusion    Pericardiocentesis 09/2020 - [facility]  . Prostate cancer ([place])   . Severe aortic stenosis     Assessment: 86yo male c/o CP and CHF with multiple recent hospital admissions at outside health systems (severe AS, pericardial effusion, tamponade, pericarditis, NSTEMI, pleural effusions), to begin heparin for ACS, elevated troponin (1053 here, trending up, had been >8000 at OSH recently).  Goal of Therapy:  Heparin level 0.3-0.7 units/ml Monitor platelets by anticoagulation protocol: Yes   Plan:  Will give heparin 2000 units IV bolus x1 followed by gtt at 500 units/hr and monitor heparin levels and CBC.  Wynona Neat, PharmD, BCPS  10/15/2020,12:19 AM

## 2020-10-15 NOTE — ED Notes (Signed)
Pt cleaned d/t external catheter failure. New linens, gown and warm blankets applied. Pt repositioned in bed for comfort and denied any other needs at this time. Call bell within reach, bed in low position. Will continue to monitor.

## 2020-10-15 NOTE — Progress Notes (Signed)
Progress Note  Patient Name: Christopher Carr Date of Encounter: 10/15/2020  Primary Cardiologist: Rozann Lesches, MD ([place])  Subjective   No chest pain or breathlessness this morning.  Inpatient Medications    Scheduled Meds: . aspirin EC  81 mg Oral Daily  . colchicine  0.6 mg Oral BID  . furosemide  40 mg Intravenous BID  . tamsulosin  0.4 mg Oral QPC supper  . timolol  1 drop Both Eyes Daily   Continuous Infusions: . heparin 500 Units/hr (10/15/20 0109)    Vital Signs    Vitals:   10/15/20 0530 10/15/20 0600 10/15/20 0700 10/15/20 0800  BP:  118/64 (!) 115/47 (!) 125/57  Pulse: 78  77 76  Resp: 18 15 15 20   Temp:      TempSrc:      SpO2: 100% 100% 97% 100%  Weight:      Height:        Intake/Output Summary (Last 24 hours) at 10/15/2020 0851 Last data filed at 09/21/2020 2200 Gross per 24 hour  Intake --  Output 650 ml  Net -650 ml   Filed Weights   10/06/2020 0144  Weight: 45.4 kg    Telemetry    Sinus rhythm.  Personally reviewed.  ECG    An ECG dated 10/15/2020 was personally reviewed today and demonstrated:  Sinus rhythm with LVH and significant repolarization abnormalities, ischemia not excluded.  Physical Exam   GEN:  Elderly male.  No acute distress.   Neck: No JVD. Cardiac: RRR, 3-5/5 basal systolic murmur, no gallop.  Respiratory: Nonlabored.  Decreased breath sounds in the bases with scattered rhonchi. GI: Soft, nontender, bowel sounds present. MS: No edema; No deformity. Neuro:  Nonfocal. Psych: Alert and oriented x 3. Normal affect.  Significant hearing loss evident.  Labs    Chemistry Recent Labs  Lab 09/19/2020 0201 10/15/20 0009  NA 128* 130*  K 4.3 3.7  CL 96* 94*  CO2 24 24  GLUCOSE 99 86  BUN 21 21  CREATININE 0.67 0.73  CALCIUM 8.8* 8.7*  GFRNONAA >60 >60  ANIONGAP 8 12     Hematology Recent Labs  Lab 09/29/2020 0201  WBC 9.0  RBC 3.46*  HGB 10.6*  HCT 32.8*  MCV 94.8  MCH 30.6  MCHC 32.3  RDW 15.0   PLT 311    Cardiac Enzymes Recent Labs  Lab 09/22/2020 0201 09/25/2020 0357 10/15/20 0009 10/15/20 0240  TROPONINIHS 996* 1,053* 1,017* 991*    BNP Recent Labs  Lab 09/26/2020 0201  BNP 3,201.0*     Radiology    DG Chest Port 1 View  Result Date: 09/21/2020 CLINICAL DATA:  Chest pain EXAM: PORTABLE CHEST 1 VIEW COMPARISON:  10/06/2020 FINDINGS: Cardiac shadow is stable. Aortic calcifications are again noted. Lungs are well aerated bilaterally. Small effusions are seen bilaterally. Mild interstitial changes are noted consistent with edema slightly improved from the prior exam. No bony abnormality is noted. IMPRESSION: Mild interstitial edema slightly improved from the prior exam. Electronically Signed   By: Inez Catalina M.D.   On: 10/02/2020 02:34    Patient Profile     86 y.o. male with severe calcific aortic stenosis, cardiomyopathy with LVEF ranging 45-55% and associated with recent acute on chronic combined heart failure, recent large pericardial effusion with tamponade status post pericardiocentesis at [facility] in November presenting with worsening shortness of breath and chest pain.  Assessment & Plan    1.  Acute on chronic combined heart failure  with mild LV dysfunction based on recent outside echocardiography.  Presents with small bilateral pleural effusions and pulmonary edema, improving with IV Lasix.  2.  Severe calcific aortic stenosis based on outside echocardiography in the [facility] system, mean gradient 48 mmHg.  He is not a surgical candidate.  Per discussion with patient and his daughter yesterday, they would like a consultation regarding TAVR, although I have spoken with them about considering conservative measures and palliative care.  3.  Status post pericardiocentesis for large pericardial effusion with tamponade at [facility] on November 16, 250 cc removed.  Etiology not entirely clear.  Was treated for pericarditis, presumably viral.  4.  NSTEMI in the setting of  problems 1 and 2.  No active chest pain at this time.  Peak high-sensitivity troponin I of 1053.  5.  Failure to thrive, reported weight loss and decreased appetite over the last 6 months per daughter.  Patient for transfer to [facility], telemetry bed in anticipation of TAVR consultation.  He was already accepted in transfer yesterday.  Overall prognosis is poor, I have spoken with the patient and his daughter at bedside yesterday about comfort measures and palliative care consultation, they would like formal consultation with the structural heart team first and make a decision from there.  Echocardiogram will be updated.  Continue aspirin, heparin, and IV Lasix.  Signed, Rozann Lesches, MD  10/15/2020, 8:51 AM

## 2020-10-16 DIAGNOSIS — I350 Nonrheumatic aortic (valve) stenosis: Secondary | ICD-10-CM | POA: Diagnosis not present

## 2020-10-16 DIAGNOSIS — I214 Non-ST elevation (NSTEMI) myocardial infarction: Secondary | ICD-10-CM | POA: Diagnosis not present

## 2020-10-16 LAB — CBC
HCT: 39 % (ref 39.0–52.0)
Hemoglobin: 13.3 g/dL (ref 13.0–17.0)
MCH: 30.9 pg (ref 26.0–34.0)
MCHC: 34.1 g/dL (ref 30.0–36.0)
MCV: 90.5 fL (ref 80.0–100.0)
Platelets: 350 10*3/uL (ref 150–400)
RBC: 4.31 MIL/uL (ref 4.22–5.81)
RDW: 15 % (ref 11.5–15.5)
WBC: 10.4 10*3/uL (ref 4.0–10.5)
nRBC: 0 % (ref 0.0–0.2)

## 2020-10-16 LAB — BASIC METABOLIC PANEL
Anion gap: 19 — ABNORMAL HIGH (ref 5–15)
BUN: 22 mg/dL (ref 8–23)
CO2: 23 mmol/L (ref 22–32)
Calcium: 9.2 mg/dL (ref 8.9–10.3)
Chloride: 91 mmol/L — ABNORMAL LOW (ref 98–111)
Creatinine, Ser: 1.04 mg/dL (ref 0.61–1.24)
GFR, Estimated: >60 mL/min (ref >60)
Glucose, Bld: 96 mg/dL (ref 70–99)
Potassium: 3.7 mmol/L (ref 3.5–5.1)
Sodium: 133 mmol/L — ABNORMAL LOW (ref 135–145)

## 2020-10-16 LAB — HEPARIN LEVEL (UNFRACTIONATED): Heparin Unfractionated: 0.43 IU/mL (ref 0.30–0.70)

## 2020-10-16 NOTE — Progress Notes (Signed)
Patient has had 3 watery BMs today.  Cadence Kathlen Mody PA notified.

## 2020-10-16 NOTE — Plan of Care (Signed)

## 2020-10-16 NOTE — Progress Notes (Signed)
ANTICOAGULATION CONSULT NOTE -   Pharmacy Consult for heparin Indication: chest pain/ACS  Allergies  Allergen Reactions  . Fluticasone Other (See Comments) and Rash    Can't use because pt. Has glaucoma Can't use because pt. Has glaucoma   . Ciprofloxacin Other (See Comments)    Other reaction(s): GI Upset (intolerance)     Patient Measurements: Height: 5\' 5"  (165.1 cm) Weight: 41 kg (90 lb 6.2 oz) IBW/kg (Calculated) : 61.5  Vital Signs: Temp: 98 F (36.7 C) (12/01 1415) Temp Source: Oral (12/01 1415) BP: 77/66 (12/01 1415) Pulse Rate: 72 (12/01 1415)  Labs: Recent Labs    10/13/2020 0201 09/18/2020 0201 09/16/2020 0357 10/15/20 0009 10/15/20 0240 10/15/20 1025 10/15/20 2125 10/16/20 0336 10/16/20 0751  HGB 10.6*   < >  --   --   --  12.0*  --   --  13.3  HCT 32.8*  --   --   --   --  36.5*  --   --  39.0  PLT 311  --   --   --   --  321  --   --  350  HEPARINUNFRC  --   --   --   --   --  0.26* 0.46 0.43  --   CREATININE 0.67  --   --  0.73  --   --   --   --  1.04  TROPONINIHS 996*   < > 1,053* 1,017* 991*  --   --   --   --    < > = values in this interval not displayed.    Estimated Creatinine Clearance: 26.3 mL/min (by C-G formula based on SCr of 1.04 mg/dL).   Medical History: Past Medical History:  Diagnosis Date  . Cardiomyopathy ([place])   . Hearing loss   . NSTEMI (non-ST elevated myocardial infarction) ([place])   . Pericardial effusion    Pericardiocentesis 09/2020 - [facility]  . Prostate cancer ([place])   . Severe aortic stenosis     Assessment: 86yo male c/o CP and CHF with multiple recent hospital admissions at outside health systems (severe AS, pericardial effusion, tamponade, pericarditis, NSTEMI, pleural effusions), on heparin for ACS. Plans noted for 48 hours heparin   Goal of Therapy:  Heparin level 0.3-0.7 units/ml Monitor platelets by anticoagulation protocol: Yes   Plan:  Continue heparin infusion at 600 units/hr Monitor heparin level and  CBC daily Anticipate stopping heparin evening on 12/2  Hildred Laser, PharmD Clinical Pharmacist **Pharmacist phone directory can now be found on [email or web address] ([email or web address]).  Listed under [place].

## 2020-10-16 NOTE — Progress Notes (Addendum)
Progress Note  Patient Name: Christopher Carr Date of Encounter: 10/16/2020  [facility] Cardiologist: Rozann Lesches, MD   Subjective   Transferred from [facility] for TAVR consult and further diuresis. Patient is hard of hearing so its difficult during interview. HE put out 2.4L overnight. He denies chest pain. He is laying flat during exam. Pressures soft this morning.  Inpatient Medications    Scheduled Meds: . aspirin EC  81 mg Oral Daily  . colchicine  0.6 mg Oral BID  . furosemide  40 mg Intravenous BID  . tamsulosin  0.4 mg Oral QPC supper  . timolol  1 drop Both Eyes Daily   Continuous Infusions: . heparin 600 Units/hr (10/15/20 2058)   PRN Meds:    Vital Signs    Vitals:   10/15/20 2000 10/15/20 2112 10/16/20 0000 10/16/20 0500  BP: (!) 113/54 (!) 100/57 (!) 118/58 (!) 99/42  Pulse: 79 89 86 84  Resp: (!) 25 17 16 18   Temp:  97.9 F (36.6 C) 97.9 F (36.6 C) 98 F (36.7 C)  TempSrc:  Oral Oral Oral  SpO2: 99% 100% 100% 100%  Weight:  41.4 kg  41 kg  Height:  5\' 5"  (1.651 m)      Intake/Output Summary (Last 24 hours) at 10/16/2020 0734 Last data filed at 10/16/2020 0459 Gross per 24 hour  Intake 228 ml  Output 2400 ml  Net -2172 ml   Last 3 Weights 10/16/2020 10/15/2020 10/04/2020  Weight (lbs) 90 lb 6.2 oz 91 lb 4.3 oz 100 lb  Weight (kg) 41 kg 41.4 kg 45.36 kg      Telemetry    NSR, HR 80s - Personally Reviewed  ECG    NSR, 79 bpm, LVH with repol abnormalities - Personally Reviewed  Physical Exam   GEN: No acute distress.   Neck: No JVD Cardiac: RRR, + murmur, no rubs, or gallops.  Respiratory: Clear to auscultation bilaterally. GI: Soft, nontender, non-distended  MS: No edema; No deformity. Neuro:  Nonfocal  Psych: Normal affect   Labs    High Sensitivity Troponin:   Recent Labs  Lab 10/13/2020 0201 10/05/2020 0357 10/15/20 0009 10/15/20 0240  TROPONINIHS 996* 1,053* 1,017* 991*      Chemistry Recent Labs  Lab 09/30/2020 0201  10/15/20 0009  NA 128* 130*  K 4.3 3.7  CL 96* 94*  CO2 24 24  GLUCOSE 99 86  BUN 21 21  CREATININE 0.67 0.73  CALCIUM 8.8* 8.7*  GFRNONAA >60 >60  ANIONGAP 8 12     Hematology Recent Labs  Lab 10/01/2020 0201 10/15/20 1025  WBC 9.0 10.5  RBC 3.46* 3.87*  HGB 10.6* 12.0*  HCT 32.8* 36.5*  MCV 94.8 94.3  MCH 30.6 31.0  MCHC 32.3 32.9  RDW 15.0 15.0  PLT 311 321    BNP Recent Labs  Lab 09/29/2020 0201  BNP 3,201.0*     DDimer No results for input(s): DDIMER in the last 168 hours.   Radiology    ECHOCARDIOGRAM COMPLETE  Result Date: 10/15/2020    ECHOCARDIOGRAM REPORT   Patient Name:   Christopher Carr Date of Exam: 10/15/2020 Medical Rec #:  [id number]     Height:       64.0 in Accession #:    [id number]    Weight:       100.0 lb Date of Birth:  1926/09/17     BSA:          1.457 m Patient  Age:    86 years      BP:           99/54 mmHg Patient Gender: M             HR:           70 bpm. Exam Location:  [facility] Procedure: 2D Echo Indications:    Aortic Stenosis 424.1 / 135.0  History:        Patient has prior history of Echocardiogram examinations, most                 recent 10/07/2020. Previous Myocardial Infarction, Aortic Valve                 Disease, Signs/Symptoms:Chest Pain; Risk Factors:Former Smoker.                 Pericardial Efffusion, Prostate Cancer.  Sonographer:    Leavy Cella RDCS (AE) Referring Phys: [place]  1. Left ventricular ejection fraction, by estimation, is approximately 35 to 40%. The left ventricle has moderately decreased function. The left ventricle demonstrates regional wall motion abnormalities (see scoring diagram/findings for description). There is mild left ventricular hypertrophy. Left ventricular diastolic parameters are consistent with Grade II diastolic dysfunction (pseudonormalization).  2. Right ventricular systolic function is normal. The right ventricular size is normal. There is moderately elevated  pulmonary artery systolic pressure. The estimated right ventricular systolic pressure is 42.5 mmHg.  3. Left atrial size was moderately dilated.  4. There is a trivial pericardial effusion posterior to the left ventricle.  5. The mitral valve is abnormal. Mild mitral valve regurgitation.  6. The aortic valve is tricuspid. There is severe calcifcation of the aortic valve. Aortic valve regurgitation is mild to moderate and peripheral rather than central. Aortic valve mean gradient measures 26.0 mmHg. Aortic valve Vmax measures 3.39 m/s. Dimentionless index is 0.18. Consistent with severe, low gradient aortic stenosis.  7. The inferior vena cava is normal in size with greater than 50% respiratory variability, suggesting right atrial pressure of 3 mmHg. FINDINGS  Left Ventricle: Left ventricular ejection fraction, by estimation, is 35 to 40%. The left ventricle has moderately decreased function. The left ventricle demonstrates regional wall motion abnormalities. The left ventricular internal cavity size was normal in size. There is mild left ventricular hypertrophy. Left ventricular diastolic parameters are consistent with Grade II diastolic dysfunction (pseudonormalization).  LV Wall Scoring: The mid and distal anterior septum, mid inferoseptal segment, apical anterior segment, apical inferior segment, and apex are hypokinetic. The anterior wall, entire lateral wall, inferior wall, basal anteroseptal segment, and basal inferoseptal segment are normal. Right Ventricle: The right ventricular size is normal. No increase in right ventricular wall thickness. Right ventricular systolic function is normal. There is moderately elevated pulmonary artery systolic pressure. The tricuspid regurgitant velocity is 3.51 m/s, and with an assumed right atrial pressure of 3 mmHg, the estimated right ventricular systolic pressure is 95.6 mmHg. Left Atrium: Left atrial size was moderately dilated. Right Atrium: Right atrial size was  normal in size. Pericardium: Trivial pericardial effusion is present. The pericardial effusion is posterior to the left ventricle. Mitral Valve: The mitral valve is abnormal. There is mild thickening of the mitral valve leaflet(s). There is mild calcification of the mitral valve leaflet(s). Mild mitral valve regurgitation. Tricuspid Valve: The tricuspid valve is grossly normal. Tricuspid valve regurgitation is mild. Aortic Valve: The aortic valve is tricuspid. There is severe calcifcation of the aortic valve. There is  moderate aortic valve annular calcification. Aortic valve regurgitation is mild to moderate. Severe aortic stenosis is present. Aortic valve mean gradient measures 26.0 mmHg. Aortic valve peak gradient measures 46.1 mmHg. Aortic valve area, by VTI measures 0.55 cm. Pulmonic Valve: The pulmonic valve was not well visualized. Pulmonic valve regurgitation is trivial. Aorta: The aortic root is normal in size and structure. Venous: The inferior vena cava is normal in size with greater than 50% respiratory variability, suggesting right atrial pressure of 3 mmHg. IAS/Shunts: No atrial level shunt detected by color flow Doppler.  LEFT VENTRICLE PLAX 2D LVIDd:         4.05 cm  Diastology LVIDs:         3.01 cm  LV e' medial:    3.08 cm/s LV PW:         1.12 cm  LV E/e' medial:  33.4 LV IVS:        1.04 cm  LV e' lateral:   6.38 cm/s LVOT diam:     2.00 cm  LV E/e' lateral: 16.1 LV SV:         43 LV SV Index:   29 LVOT Area:     3.14 cm  RIGHT VENTRICLE RV S prime:     12.60 cm/s TAPSE (M-mode): 1.7 cm LEFT ATRIUM             Index       RIGHT ATRIUM           Index LA diam:        3.20 cm 2.20 cm/m  RA Area:     11.20 cm LA Vol (A2C):   44.5 ml 30.54 ml/m RA Volume:   26.30 ml  18.05 ml/m LA Vol (A4C):   63.4 ml 43.51 ml/m LA Biplane Vol: 58.4 ml 40.08 ml/m  AORTIC VALVE AV Area (Vmax):    0.48 cm AV Area (Vmean):   0.46 cm AV Area (VTI):     0.55 cm AV Vmax:           339.33 cm/s AV Vmean:           236.000 cm/s AV VTI:            0.770 m AV Peak Grad:      46.1 mmHg AV Mean Grad:      26.0 mmHg LVOT Vmax:         51.70 cm/s LVOT Vmean:        34.800 cm/s LVOT VTI:          0.136 m LVOT/AV VTI ratio: 0.18  AORTA Ao Root diam: 2.50 cm MITRAL VALVE                TRICUSPID VALVE MV Area (PHT): 4.41 cm     TR Peak grad:   49.3 mmHg MV Decel Time: 172 msec     TR Vmax:        351.00 cm/s MR Peak grad: 92.9 mmHg MR Mean grad: 63.0 mmHg     SHUNTS MR Vmax:      482.00 cm/s   Systemic VTI:  0.14 m MR Vmean:     376.0 cm/s    Systemic Diam: 2.00 cm MV E velocity: 103.00 cm/s MV A velocity: 48.00 cm/s MV E/A ratio:  2.15 Rozann Lesches MD Electronically signed by Rozann Lesches MD Signature Date/Time: 10/15/2020/4:37:58 PM    Final     Cardiac Studies   Echo 10/15/20   1. Left ventricular ejection  fraction, by estimation, is approximately 35  to 40%. The left ventricle has moderately decreased function. The left  ventricle demonstrates regional wall motion abnormalities (see scoring  diagram/findings for description).  There is mild left ventricular hypertrophy. Left ventricular diastolic  parameters are consistent with Grade II diastolic dysfunction  (pseudonormalization).  2. Right ventricular systolic function is normal. The right ventricular  size is normal. There is moderately elevated pulmonary artery systolic  pressure. The estimated right ventricular systolic pressure is 62.7 mmHg.  3. Left atrial size was moderately dilated.  4. There is a trivial pericardial effusion posterior to the left  ventricle.  5. The mitral valve is abnormal. Mild mitral valve regurgitation.  6. The aortic valve is tricuspid. There is severe calcifcation of the  aortic valve. Aortic valve regurgitation is mild to moderate and  peripheral rather than central. Aortic valve mean gradient measures 26.0  mmHg. Aortic valve Vmax measures 3.39 m/s.  Dimentionless index is 0.18. Consistent with severe, low  gradient aortic  stenosis.  7. The inferior vena cava is normal in size with greater than 50%  respiratory variability, suggesting right atrial pressure of 3 mmHg.   Patient Profile     86 y.o. male with severe calcific aortic stenosis, cardiomyopathy with LVEF ranging 45-55% and associated with recent acute on chronic combined heart failure, recent large pericardial effusion with tamponade status post pericardiocentesis at [facility] in November presenting with worsening shortness of breath and chest pain.  Assessment & Plan    Acute on chronic combined CHF with mild LV dysfunction - presented with small bilateral pleural effusions and pulmonary edema - LVEF 45-55% per outside echo. Echo this admission showed LVEF 35-40% -IV lasix 40 mg BID - AM labs pending - Patient pu tout 2.4L overnight - weight down 100>90lbs - BNP significantly elevated on arrival to 3000 however does not appear significantly volume overloaded on exam. AM labs pending  Severe AS - per outside echo report  - echo this admission showed severe calcification, mild to mod AI, severe low gradient AS, mean gradient 5mmHg - mean gradient 55mmHg - per prior discussion patient is not a surgical candidate. Daughter would like TAVR consult - structural heart team to see  Large pericardial effusion with tamponade s/p pericardiocentesis at [facility] 11/16 - 250cc fluid removed - etiology unclear - patient was treated for pericarditis - Echo this admission showed trivial pericardial effusion  NSTEMI - Troponin peaked at 1053 - suspected demand ischemia in the setting of Acute CHF and AS - No active chest pain - IV heparinx 48 hours - continue aspirin  Failure to thrive - consider palliative  For questions or updates, please contact [facility] Please consult [email or web address] for contact info under        Signed, Cadence Ninfa Meeker, PA-C  10/16/2020, 7:34 AM    Patient seen and examined and agree with Cadence Kathlen Mody,  PA-C as detailed above.  In brief, the patient is a 86 y.o. male with a hx of severe AS, chronic systolic/diastolic CHF (EF 03-50%), pericarditis, pericardial effusion with tamponade s/p recent pericardiocentesis, protein calorie malnutrition, failure to thrive, who was transferred for further evaluation of his AS.   Per discussion with the structural heart team, it is unlikely that the patient would like to proceed with extensive TAVR work-up. In addition, due to his frailty, advanced age and multiple comorbidities, the risks of the procedure outweigh the benefits. Will proceed with palliative care and social work evaluation at this time.  Exam: GEN: Elderly, cachectic male, very hard of hearing   Neck: No JVD Cardiac: RRR, 3/6 systolic murmur best heard in the RUSB that radiates to the caroitds Respiratory: Clear to auscultation bilaterally. GI: Soft, nontender, non-distended  MS: 1+ bilateral LE edema. Neuro:  Nonfocal. Very hard of hearing Psych: Normal affect   Plan: -Given advanced age, frailty and significant comorbidities, the patient is deemed likely not a good candidate for TAVR at this time -Patient's daughter is in agreement and amenable for palliative care and social work evaluation -Will likely need SNF as family is having difficulty managing at home -Continue heparin gtt x 48hours for NSTEMI -Stop lasix as patient appears to have low RAP on TTE and is pre-load dependent in the setting of severe AS -May need PO lasix tomorrow; will monitor  Gwyndolyn Kaufman, MD

## 2020-10-16 DEATH — deceased

## 2020-10-17 DIAGNOSIS — I214 Non-ST elevation (NSTEMI) myocardial infarction: Secondary | ICD-10-CM | POA: Diagnosis not present

## 2020-10-17 LAB — C DIFFICILE QUICK SCREEN W PCR REFLEX
C Diff antigen: NEGATIVE
C Diff interpretation: NOT DETECTED
C Diff toxin: NEGATIVE

## 2020-10-17 MED ORDER — ALPRAZOLAM 0.25 MG PO TABS
0.2500 mg | ORAL_TABLET | Freq: Three times a day (TID) | ORAL | Status: DC | PRN
  Administered 2020-10-17 (×2): 0.25 mg via ORAL
  Filled 2020-10-17 (×2): qty 1

## 2020-10-17 MED ORDER — ENOXAPARIN SODIUM 30 MG/0.3ML ~~LOC~~ SOLN
30.0000 mg | SUBCUTANEOUS | Status: DC
  Administered 2020-10-17: 30 mg via SUBCUTANEOUS
  Filled 2020-10-17: qty 0.3

## 2020-10-17 MED ORDER — COLCHICINE 0.6 MG PO TABS: 0.6000 mg | ORAL_TABLET | Freq: Every day | ORAL | Status: DC

## 2020-10-17 MED ORDER — HALOPERIDOL LACTATE 5 MG/ML IJ SOLN
2.0000 mg | Freq: Four times a day (QID) | INTRAMUSCULAR | Status: DC | PRN
  Administered 2020-10-17: 2 mg via INTRAVENOUS
  Filled 2020-10-17 (×2): qty 1

## 2020-10-17 MED ORDER — HALOPERIDOL 1 MG PO TABS
2.0000 mg | ORAL_TABLET | Freq: Four times a day (QID) | ORAL | Status: DC | PRN
  Filled 2020-10-17: qty 2

## 2020-10-17 MED ORDER — HALOPERIDOL LACTATE 5 MG/ML IJ SOLN
2.0000 mg | Freq: Four times a day (QID) | INTRAMUSCULAR | Status: DC | PRN
  Administered 2020-10-17 – 2020-10-18 (×3): 2 mg via INTRAMUSCULAR
  Filled 2020-10-17 (×2): qty 1

## 2020-10-17 MED ORDER — COLCHICINE 0.6 MG PO TABS
0.6000 mg | ORAL_TABLET | Freq: Every day | ORAL | Status: DC
  Administered 2020-10-17: 0.6 mg via ORAL

## 2020-10-17 NOTE — Progress Notes (Addendum)
I spoke with Dr Tamala Julian with the Hospitalist service. We reviewed Mr Mcguffee's case.  Because of the patient's low B/P our options for sedatives are limited.  I spoke with the patient's RN and he is currently stable, calm. For now continue Haldol PRN and await palliative Care consult. I decreased his colchicine to 0.6mg  QD, hopefully this will help with loose stools. He is not febrile- C-diff testing not indicated.   Kerin Ransom PA-C 10/17/2020 12:29 PM

## 2020-10-17 NOTE — Progress Notes (Addendum)
Progress Note  Patient Name: Christopher Carr Date of Encounter: 10/17/2020  [facility] Cardiologist: Rozann Lesches, MD   Subjective   Awake-confused  Inpatient Medications    Scheduled Meds:  aspirin EC  81 mg Oral Daily   colchicine  0.6 mg Oral BID   tamsulosin  0.4 mg Oral QPC supper   timolol  1 drop Both Eyes Daily   Continuous Infusions:  heparin 600 Units/hr (10/16/20 1547)   PRN Meds: haloperidol lactate   Vital Signs    Vitals:   10/16/20 1504 10/16/20 1957 10/16/20 2109 10/17/20 0451  BP: (!) 113/53 (!) 112/52 (!) 101/52 (!) 94/40  Pulse: 100 74 75 80  Resp: 18 17 16 16   Temp: 97.9 F (36.6 C) (!) 97.4 F (36.3 C) 97.8 F (36.6 C) 97.8 F (36.6 C)  TempSrc: Oral Oral Axillary Axillary  SpO2: 100% 100% 98%   Weight:    40.1 kg  Height:        Intake/Output Summary (Last 24 hours) at 10/17/2020 4268 Last data filed at 10/17/2020 0555 Gross per 24 hour  Intake 673 ml  Output 2301 ml  Net -1628 ml   Last 3 Weights 10/17/2020 10/16/2020 10/15/2020  Weight (lbs) 88 lb 6.5 oz 90 lb 6.2 oz 91 lb 4.3 oz  Weight (kg) 40.1 kg 41 kg 41.4 kg      Telemetry    NSR/ST 120 - Personally Reviewed  ECG    None new since 11/30 - Personally Reviewed  Physical Exam   GEN: Cachectic male, agitated, No acute distress.   Neck: No JVD Cardiac: RRR, decreased heart sounds Respiratory: Clear to auscultation bilaterally. GI: Soft, nontender, non-distended  MS: No edema; No deformity. Neuro:  Nonfocal  Psych: confused  Labs    High Sensitivity Troponin:   Recent Labs  Lab 10/12/2020 0201 10/15/2020 0357 10/15/20 0009 10/15/20 0240  TROPONINIHS 996* 1,053* 1,017* 991*      Chemistry Recent Labs  Lab 09/18/2020 0201 10/15/20 0009 10/16/20 0751  NA 128* 130* 133*  K 4.3 3.7 3.7  CL 96* 94* 91*  CO2 24 24 23   GLUCOSE 99 86 96  BUN 21 21 22   CREATININE 0.67 0.73 1.04  CALCIUM 8.8* 8.7* 9.2  GFRNONAA >60 >60 >60  ANIONGAP 8 12 19*      Hematology Recent Labs  Lab 09/21/2020 0201 10/15/20 1025 10/16/20 0751  WBC 9.0 10.5 10.4  RBC 3.46* 3.87* 4.31  HGB 10.6* 12.0* 13.3  HCT 32.8* 36.5* 39.0  MCV 94.8 94.3 90.5  MCH 30.6 31.0 30.9  MCHC 32.3 32.9 34.1  RDW 15.0 15.0 15.0  PLT 311 321 350    BNP Recent Labs  Lab 10/03/2020 0201  BNP 3,201.0*     DDimer No results for input(s): DDIMER in the last 168 hours.   Radiology    ECHOCARDIOGRAM COMPLETE  Result Date: 10/15/2020    ECHOCARDIOGRAM REPORT   Patient Name:   SHARONE ALMOND Date of Exam: 10/15/2020 Medical Rec #:  [id number]     Height:       64.0 in Accession #:    [id number]    Weight:       100.0 lb Date of Birth:  05/26/1926     BSA:          1.457 m Patient Age:    57 years      BP:           99/54 mmHg Patient Gender: M  HR:           70 bpm. Exam Location:  [facility] Procedure: 2D Echo Indications:    Aortic Stenosis 424.1 / 135.0  History:        Patient has prior history of Echocardiogram examinations, most                 recent 10/07/2020. Previous Myocardial Infarction, Aortic Valve                 Disease, Signs/Symptoms:Chest Pain; Risk Factors:Former Smoker.                 Pericardial Efffusion, Prostate Cancer.  Sonographer:    Leavy Cella RDCS (AE) Referring Phys: [place]  1. Left ventricular ejection fraction, by estimation, is approximately 35 to 40%. The left ventricle has moderately decreased function. The left ventricle demonstrates regional wall motion abnormalities (see scoring diagram/findings for description). There is mild left ventricular hypertrophy. Left ventricular diastolic parameters are consistent with Grade II diastolic dysfunction (pseudonormalization).  2. Right ventricular systolic function is normal. The right ventricular size is normal. There is moderately elevated pulmonary artery systolic pressure. The estimated right ventricular systolic pressure is 66.5 mmHg.  3. Left atrial size was  moderately dilated.  4. There is a trivial pericardial effusion posterior to the left ventricle.  5. The mitral valve is abnormal. Mild mitral valve regurgitation.  6. The aortic valve is tricuspid. There is severe calcifcation of the aortic valve. Aortic valve regurgitation is mild to moderate and peripheral rather than central. Aortic valve mean gradient measures 26.0 mmHg. Aortic valve Vmax measures 3.39 m/s. Dimentionless index is 0.18. Consistent with severe, low gradient aortic stenosis.  7. The inferior vena cava is normal in size with greater than 50% respiratory variability, suggesting right atrial pressure of 3 mmHg. FINDINGS  Left Ventricle: Left ventricular ejection fraction, by estimation, is 35 to 40%. The left ventricle has moderately decreased function. The left ventricle demonstrates regional wall motion abnormalities. The left ventricular internal cavity size was normal in size. There is mild left ventricular hypertrophy. Left ventricular diastolic parameters are consistent with Grade II diastolic dysfunction (pseudonormalization).  LV Wall Scoring: The mid and distal anterior septum, mid inferoseptal segment, apical anterior segment, apical inferior segment, and apex are hypokinetic. The anterior wall, entire lateral wall, inferior wall, basal anteroseptal segment, and basal inferoseptal segment are normal. Right Ventricle: The right ventricular size is normal. No increase in right ventricular wall thickness. Right ventricular systolic function is normal. There is moderately elevated pulmonary artery systolic pressure. The tricuspid regurgitant velocity is 3.51 m/s, and with an assumed right atrial pressure of 3 mmHg, the estimated right ventricular systolic pressure is 99.3 mmHg. Left Atrium: Left atrial size was moderately dilated. Right Atrium: Right atrial size was normal in size. Pericardium: Trivial pericardial effusion is present. The pericardial effusion is posterior to the left ventricle.  Mitral Valve: The mitral valve is abnormal. There is mild thickening of the mitral valve leaflet(s). There is mild calcification of the mitral valve leaflet(s). Mild mitral valve regurgitation. Tricuspid Valve: The tricuspid valve is grossly normal. Tricuspid valve regurgitation is mild. Aortic Valve: The aortic valve is tricuspid. There is severe calcifcation of the aortic valve. There is moderate aortic valve annular calcification. Aortic valve regurgitation is mild to moderate. Severe aortic stenosis is present. Aortic valve mean gradient measures 26.0 mmHg. Aortic valve peak gradient measures 46.1 mmHg. Aortic valve area, by VTI measures 0.55 cm.  Pulmonic Valve: The pulmonic valve was not well visualized. Pulmonic valve regurgitation is trivial. Aorta: The aortic root is normal in size and structure. Venous: The inferior vena cava is normal in size with greater than 50% respiratory variability, suggesting right atrial pressure of 3 mmHg. IAS/Shunts: No atrial level shunt detected by color flow Doppler.  LEFT VENTRICLE PLAX 2D LVIDd:         4.05 cm  Diastology LVIDs:         3.01 cm  LV e' medial:    3.08 cm/s LV PW:         1.12 cm  LV E/e' medial:  33.4 LV IVS:        1.04 cm  LV e' lateral:   6.38 cm/s LVOT diam:     2.00 cm  LV E/e' lateral: 16.1 LV SV:         43 LV SV Index:   29 LVOT Area:     3.14 cm  RIGHT VENTRICLE RV S prime:     12.60 cm/s TAPSE (M-mode): 1.7 cm LEFT ATRIUM             Index       RIGHT ATRIUM           Index LA diam:        3.20 cm 2.20 cm/m  RA Area:     11.20 cm LA Vol (A2C):   44.5 ml 30.54 ml/m RA Volume:   26.30 ml  18.05 ml/m LA Vol (A4C):   63.4 ml 43.51 ml/m LA Biplane Vol: 58.4 ml 40.08 ml/m  AORTIC VALVE AV Area (Vmax):    0.48 cm AV Area (Vmean):   0.46 cm AV Area (VTI):     0.55 cm AV Vmax:           339.33 cm/s AV Vmean:          236.000 cm/s AV VTI:            0.770 m AV Peak Grad:      46.1 mmHg AV Mean Grad:      26.0 mmHg LVOT Vmax:         51.70 cm/s  LVOT Vmean:        34.800 cm/s LVOT VTI:          0.136 m LVOT/AV VTI ratio: 0.18  AORTA Ao Root diam: 2.50 cm MITRAL VALVE                TRICUSPID VALVE MV Area (PHT): 4.41 cm     TR Peak grad:   49.3 mmHg MV Decel Time: 172 msec     TR Vmax:        351.00 cm/s MR Peak grad: 92.9 mmHg MR Mean grad: 63.0 mmHg     SHUNTS MR Vmax:      482.00 cm/s   Systemic VTI:  0.14 m MR Vmean:     376.0 cm/s    Systemic Diam: 2.00 cm MV E velocity: 103.00 cm/s MV A velocity: 48.00 cm/s MV E/A ratio:  2.15 Rozann Lesches MD Electronically signed by Rozann Lesches MD Signature Date/Time: 10/15/2020/4:37:58 PM    Final     Cardiac Studies   See echo report from 11/30 above  Patient Profile     86 y.o. male with severe aortic stenosis, cardiomyopathy with LVEF ranging 45-55% and associated with recent acute on chronic combined heart failure, recent large pericardial effusion, status post pericardiocentesis at [facility] in November, presented  as a transfer from [facility] to consider TAVR evaluation.   Assessment & Plan    Acute on chronic combined CHF with mild LV dysfunction - presented with small bilateral pleural effusions and pulmonary edema -  Echo this admission showed LVEF 35-40% -  Lasix stopped -  Pt refused AM labs today - Patient pu tout 1.4L overnight - weight down from 100 lbs to 88.4 lbs   Severe AS - Seen by Dr Aundra Dubin, not felt to be a candidate for TAVR   H/O pericardial effusion with tamponade s/p pericardiocentesis at [facility] 11/16 - 250cc fluid removed - etiology unclear - patient was treated for pericarditis - Echo this admission showed trivial pericardial effusion - continue colchicine   NSTEMI - Troponin peaked at 1053 - suspected demand ischemia in the setting of Acute CHF and AS - No active chest pain - IV heparinx 48 hours- now off  - continue aspirin  AKI Slight rise overnight. Holding diuresis. PO intake poor. -Renally dose medications -No further diuresis -Trend    Failure to thrive - Palliative care needs to be discussed with the family.  Plan- He pulled his IV out-the plan was for Heparin for 48 hours so will discontinue this now and start Sq Lovenox for DVT prophylaxis.  I would like to add low dose beta blocker but his systolic B/P is low. RN reports pt agitated at times, he fell out of bed this am. Haldol PRN has not been very effective.  He continues to have loose stools- continue enteric precautions.    For questions or updates, please contact [place] Please consult [email or web address] for contact info under        Signed, Kerin Ransom, PA-C  10/17/2020, 9:38 AM    Patient seen and examined and agree with Kerin Ransom, PA-C as detailed above.  In brief, the patient is a 86 year old male with severe AS, cardiomyopathy with LVEF ranging 45-55% with recent acute on chronic combined heart failure, recent large pericardial effusion s/p pericardiocentesis at [facility] in November, who presented as a transfer from [facility] to consider TAVR evaluation. Patient deemed too high risk. Now planned for palliative measures.  Today, patient is very agitated, trying to get out of bed, and is very confused. Continues to have loose stool.  Exam: GEN: Very agitated, confused, cachectic Neck: No JVD Cardiac: Tachycardic, regular, 3/6 systolic murmur at RUSB Respiratory: Clear to auscultation bilaterally. GI: Soft, nontender, non-distended  MS: No edema; No deformity. Neuro:  Confused, agitated Psych: Very agitated  Plan: -Given advanced age, frailty and significant comorbidities, the patient is deemed likely not a good candidate for TAVR at this time -Patient's daughter is in agreement and amenable for palliative care and social work evaluation -Will likely need SNF as family is having difficulty managing at home -Palliative care consulted -Will consult IM for help with management of agitation and work-up of loose stools -Will start haldol and resume home  alprazolam to help with severe agitation  Gwyndolyn Kaufman, MD

## 2020-10-17 NOTE — Progress Notes (Signed)
Called Berman Grainger pts daughter and contact and updated her on pt. Made aware that pt needed PRN medication and that he refused lab work. Blanch Media verbalized understanding and appreciation of update.

## 2020-10-17 NOTE — Progress Notes (Signed)
Pt refused lab work.

## 2020-10-17 NOTE — Progress Notes (Signed)
Pt pulled IV out, pulled tele off and is trying to climb out of bed. Nurse at bedside. Paged Dr. Vickki Muff. Awaiting call back.

## 2020-10-17 NOTE — Progress Notes (Signed)
Pharmacist Heart Failure Core Measure Documentation  Assessment: Christopher Carr has an EF documented as 35-40% on 10/15/20 by Echo.  Rationale: Heart failure patients with left ventricular systolic dysfunction (LVSD) and an EF < 40% should be prescribed an angiotensin converting enzyme inhibitor (ACEI) or angiotensin receptor blocker (ARB) at discharge unless a contraindication is documented in the medical record.  This patient is not currently on an ACEI or ARB for HF.  This note is being placed in the record in order to provide documentation that a contraindication to the use of these agents is present for this encounter.  ACE Inhibitor or Angiotensin Receptor Blocker is contraindicated (specify all that apply)  []   ACEI allergy AND ARB allergy []   Angioedema []   Moderate or severe aortic stenosis []   Hyperkalemia [x]   Hypotension []   Renal artery stenosis []   Worsening renal function, preexisting renal disease or dysfunction  Christopher Carr, PharmD Clinical Pharmacist **Pharmacist phone directory can now be found on [email or web address] ([email or web address]).  Listed under [facility].

## 2020-10-18 DIAGNOSIS — I429 Cardiomyopathy, unspecified: Secondary | ICD-10-CM

## 2020-10-18 DIAGNOSIS — I350 Nonrheumatic aortic (valve) stenosis: Secondary | ICD-10-CM

## 2020-10-18 DIAGNOSIS — I3139 Other pericardial effusion (noninflammatory): Secondary | ICD-10-CM

## 2020-10-18 DIAGNOSIS — R4182 Altered mental status, unspecified: Secondary | ICD-10-CM

## 2020-10-18 DIAGNOSIS — R6251 Failure to thrive (child): Secondary | ICD-10-CM

## 2020-10-18 DIAGNOSIS — E46 Unspecified protein-calorie malnutrition: Secondary | ICD-10-CM

## 2020-10-18 DIAGNOSIS — I5043 Acute on chronic combined systolic (congestive) and diastolic (congestive) heart failure: Secondary | ICD-10-CM

## 2020-10-18 LAB — CBC
HCT: 35.5 % — ABNORMAL LOW (ref 39.0–52.0)
Hemoglobin: 11.8 g/dL — ABNORMAL LOW (ref 13.0–17.0)
MCH: 30.3 pg (ref 26.0–34.0)
MCHC: 33.2 g/dL (ref 30.0–36.0)
MCV: 91 fL (ref 80.0–100.0)
Platelets: 318 10*3/uL (ref 150–400)
RBC: 3.9 MIL/uL — ABNORMAL LOW (ref 4.22–5.81)
RDW: 14.7 % (ref 11.5–15.5)
WBC: 13 10*3/uL — ABNORMAL HIGH (ref 4.0–10.5)
nRBC: 0 % (ref 0.0–0.2)

## 2020-10-18 LAB — BASIC METABOLIC PANEL
Anion gap: 19 — ABNORMAL HIGH (ref 5–15)
BUN: 29 mg/dL — ABNORMAL HIGH (ref 8–23)
CO2: 20 mmol/L — ABNORMAL LOW (ref 22–32)
Calcium: 9.1 mg/dL (ref 8.9–10.3)
Chloride: 92 mmol/L — ABNORMAL LOW (ref 98–111)
Creatinine, Ser: 1 mg/dL (ref 0.61–1.24)
GFR, Estimated: >60 mL/min (ref >60)
Glucose, Bld: 93 mg/dL (ref 70–99)
Potassium: 3 mmol/L — ABNORMAL LOW (ref 3.5–5.1)
Sodium: 131 mmol/L — ABNORMAL LOW (ref 135–145)

## 2020-11-16 NOTE — Progress Notes (Signed)
Pt expired 0415 verified by 3 RNs. Pt DNR. Dr. Samul Dada made aware.

## 2020-11-16 NOTE — Progress Notes (Signed)
Dr. Samul Dada contacted family and they are getting together and will be coming to see pt.

## 2020-11-16 NOTE — Plan of Care (Signed)

## 2020-11-16 NOTE — Death Summary Note (Addendum)
Death Summary    Patient ID: Christopher Carr MRN: [id number]; DOB: [birth year]  Admit Date: 2020/11/01 Date of Death: 2020/11/05  Primary Care Provider: Caryl Bis, MD  Primary Cardiologist: Rozann Lesches, MD  Primary Electrophysiologist:  None   Discharge Diagnoses    Principal Problem:   NSTEMI (non-ST elevated myocardial infarction) [facility]) Active Problems:   Acute on chronic combined systolic and diastolic CHF (congestive heart failure) ([place])   Cardiomyopathy ([place])   Severe aortic stenosis   Failure to thrive (child)   Pericardial effusion s/p pericardiocentisis at [facility]   Altered mental state   Protein calorie malnutrition ([place])   Diagnostic Studies/Procedures    Echo 10/15/20 IMPRESSIONS  1. Left ventricular ejection fraction, by estimation, is approximately 35  to 40%. The left ventricle has moderately decreased function. The left  ventricle demonstrates regional wall motion abnormalities (see scoring  diagram/findings for description).  There is mild left ventricular hypertrophy. Left ventricular diastolic  parameters are consistent with Grade II diastolic dysfunction  (pseudonormalization).  2. Right ventricular systolic function is normal. The right ventricular  size is normal. There is moderately elevated pulmonary artery systolic  pressure. The estimated right ventricular systolic pressure is 50.5 mmHg.  3. Left atrial size was moderately dilated.  4. There is a trivial pericardial effusion posterior to the left  ventricle.  5. The mitral valve is abnormal. Mild mitral valve regurgitation.  6. The aortic valve is tricuspid. There is severe calcifcation of the  aortic valve. Aortic valve regurgitation is mild to moderate and  peripheral rather than central. Aortic valve mean gradient measures 26.0  mmHg. Aortic valve Vmax measures 3.39 m/s.  Dimentionless index is 0.18. Consistent with severe, low gradient aortic  stenosis.  7. The inferior vena  cava is normal in size with greater than 50%  respiratory variability, suggesting right atrial pressure of 3 mmHg.   FINDINGS  Left Ventricle: Left ventricular ejection fraction, by estimation, is 35  to 40%. The left ventricle has moderately decreased function. The left  ventricle demonstrates regional wall motion abnormalities. The left  ventricular internal cavity size was  normal in size. There is mild left ventricular hypertrophy. Left  ventricular diastolic parameters are consistent with Grade II diastolic  dysfunction (pseudonormalization).   _____________   History of Present Illness     Christopher Carr is a 87 y.o. male with medical history of severe AS, chronic systolic and diastolic CHF with a EF 35 to 40%, pericarditis, pericardial effusion with tamponade s/p recent pericardiocentesis at [facility] in November 2021, protein calorie malnutrition, failure to thrive was initially seen for chest pain and acute CHF at [facility].  Patient had been recently diagnosed with severe AS and not felt to be a surgical candidate per [facility] notes.  He underwent pericardiocentesis 10/01/2020 with removal of 250 cc fluid for large pericardial effusion and tamponade.  He was treated for presumptive viral pericarditis and subsequently discharged.  He then presented to [facility] with chest pain and troponin over 8000 consistent with non-STEMI.  He was subsequently transferred to [facility].  Repeat echo showed EF 45 to 50% with trivial pericardial effusion, and large pleural effusion.  He was treated with IV diuresis.  He was not felt to be a candidate for invasive procedure including cath.  Hospital Course   Patient presented to [facility] 11-01-20 with chest pain and shortness of breath.  BNP 2201, troponin 996, and chest x-ray with mild interstitial edema improved from previous  exam.  He was started on IV heparin and IV Lasix.  Repeat echo 10/15/2020 showed EF 35 to 40% with wall  motion abnormalities, grade 2 diastolic dysfunction moderately elevated pulmonary artery pressure, trivial pericardial effusion mild MR, severe low-flow low gradient AMS with a mean gradient of 26 mmHg, peak gradient 46 mmHg, mild to moderate AR.  He was seen in consultation by Dr. Domenic Polite who thought his prognosis was poor but family insistent on TAVR evaluation so patient was transferred to [facility] for structural heart consultation.  He was continued on IV Lasix and IV heparin on arrival.  Heart team saw patient and he was deemed not a good TAVR candidate for TAVR work-up, including cardiac cath. Palliative care was consulted but ultimately never saw the patient. Patient was DNR. Patient became confused and pulled out his IV.  He was started on sq Lovenox.  Overall prognosis seemed very poor. Patient's condition continued to worsen with hypotension, AMS, and diarrhea. Medications were slowly stopped and home haldol and Xanax were re-started. On the monring of November 01, 2020 the patient passed 0415 and it was verified by 3RNs. The family was made aware.   Consultants: Structural heart team  Time of death: 4315 _____________  Labs & Radiologic Studies    CBC Recent Labs    10/16/20 0751 2020-11-01 0236  WBC 10.4 13.0*  HGB 13.3 11.8*  HCT 39.0 35.5*  MCV 90.5 91.0  PLT 350 400   Basic Metabolic Panel Recent Labs    10/16/20 0751 11/01/2020 0236  NA 133* 131*  K 3.7 3.0*  CL 91* 92*  CO2 23 20*  GLUCOSE 96 93  BUN 22 29*  CREATININE 1.04 1.00  CALCIUM 9.2 9.1   Liver Function Tests No results for input(s): AST, ALT, ALKPHOS, BILITOT, PROT, ALBUMIN in the last 72 hours. No results for input(s): LIPASE, AMYLASE in the last 72 hours. High Sensitivity Troponin:   Recent Labs  Lab 10/09/2020 0201 10/03/2020 0357 10/15/20 0009 10/15/20 0240  TROPONINIHS 996* 1,053* 1,017* 991*    BNP Invalid input(s): POCBNP D-Dimer No results for input(s): DDIMER in the last 72 hours. Hemoglobin  A1C No results for input(s): HGBA1C in the last 72 hours. Fasting Lipid Panel No results for input(s): CHOL, HDL, LDLCALC, TRIG, CHOLHDL, LDLDIRECT in the last 72 hours. Thyroid Function Tests No results for input(s): TSH, T4TOTAL, T3FREE, THYROIDAB in the last 72 hours.  Invalid input(s): FREET3 _____________  DG Chest Port 1 View  Result Date: 09/29/2020 CLINICAL DATA:  Chest pain EXAM: PORTABLE CHEST 1 VIEW COMPARISON:  10/06/2020 FINDINGS: Cardiac shadow is stable. Aortic calcifications are again noted. Lungs are well aerated bilaterally. Small effusions are seen bilaterally. Mild interstitial changes are noted consistent with edema slightly improved from the prior exam. No bony abnormality is noted. IMPRESSION: Mild interstitial edema slightly improved from the prior exam. Electronically Signed   By: Inez Catalina M.D.   On: 09/28/2020 02:34   ECHOCARDIOGRAM COMPLETE  Result Date: 10/15/2020    ECHOCARDIOGRAM REPORT   Patient Name:   Christopher Carr Date of Exam: 10/15/2020 Medical Rec #:  [id number]     Height:       64.0 in Accession #:    [id number]    Weight:       100.0 lb Date of Birth:  [birth year]     BSA:          1.457 m Patient Age:    8 years      BP:  99/54 mmHg Patient Gender: M             HR:           70 bpm. Exam Location:  [facility] Procedure: 2D Echo Indications:    Aortic Stenosis 424.1 / 135.0  History:        Patient has prior history of Echocardiogram examinations, most                 recent 10/07/2020. Previous Myocardial Infarction, Aortic Valve                 Disease, Signs/Symptoms:Chest Pain; Risk Factors:Former Smoker.                 Pericardial Efffusion, Prostate Cancer.  Sonographer:    Leavy Cella RDCS (AE) Referring Phys: [place]  1. Left ventricular ejection fraction, by estimation, is approximately 35 to 40%. The left ventricle has moderately decreased function. The left ventricle demonstrates regional wall motion  abnormalities (see scoring diagram/findings for description). There is mild left ventricular hypertrophy. Left ventricular diastolic parameters are consistent with Grade II diastolic dysfunction (pseudonormalization).  2. Right ventricular systolic function is normal. The right ventricular size is normal. There is moderately elevated pulmonary artery systolic pressure. The estimated right ventricular systolic pressure is 21.3 mmHg.  3. Left atrial size was moderately dilated.  4. There is a trivial pericardial effusion posterior to the left ventricle.  5. The mitral valve is abnormal. Mild mitral valve regurgitation.  6. The aortic valve is tricuspid. There is severe calcifcation of the aortic valve. Aortic valve regurgitation is mild to moderate and peripheral rather than central. Aortic valve mean gradient measures 26.0 mmHg. Aortic valve Vmax measures 3.39 m/s. Dimentionless index is 0.18. Consistent with severe, low gradient aortic stenosis.  7. The inferior vena cava is normal in size with greater than 50% respiratory variability, suggesting right atrial pressure of 3 mmHg. FINDINGS  Left Ventricle: Left ventricular ejection fraction, by estimation, is 35 to 40%. The left ventricle has moderately decreased function. The left ventricle demonstrates regional wall motion abnormalities. The left ventricular internal cavity size was normal in size. There is mild left ventricular hypertrophy. Left ventricular diastolic parameters are consistent with Grade II diastolic dysfunction (pseudonormalization).  LV Wall Scoring: The mid and distal anterior septum, mid inferoseptal segment, apical anterior segment, apical inferior segment, and apex are hypokinetic. The anterior wall, entire lateral wall, inferior wall, basal anteroseptal segment, and basal inferoseptal segment are normal. Right Ventricle: The right ventricular size is normal. No increase in right ventricular wall thickness. Right ventricular systolic function  is normal. There is moderately elevated pulmonary artery systolic pressure. The tricuspid regurgitant velocity is 3.51 m/s, and with an assumed right atrial pressure of 3 mmHg, the estimated right ventricular systolic pressure is 08.6 mmHg. Left Atrium: Left atrial size was moderately dilated. Right Atrium: Right atrial size was normal in size. Pericardium: Trivial pericardial effusion is present. The pericardial effusion is posterior to the left ventricle. Mitral Valve: The mitral valve is abnormal. There is mild thickening of the mitral valve leaflet(s). There is mild calcification of the mitral valve leaflet(s). Mild mitral valve regurgitation. Tricuspid Valve: The tricuspid valve is grossly normal. Tricuspid valve regurgitation is mild. Aortic Valve: The aortic valve is tricuspid. There is severe calcifcation of the aortic valve. There is moderate aortic valve annular calcification. Aortic valve regurgitation is mild to moderate. Severe aortic stenosis is present. Aortic valve mean gradient measures  26.0 mmHg. Aortic valve peak gradient measures 46.1 mmHg. Aortic valve area, by VTI measures 0.55 cm. Pulmonic Valve: The pulmonic valve was not well visualized. Pulmonic valve regurgitation is trivial. Aorta: The aortic root is normal in size and structure. Venous: The inferior vena cava is normal in size with greater than 50% respiratory variability, suggesting right atrial pressure of 3 mmHg. IAS/Shunts: No atrial level shunt detected by color flow Doppler.  LEFT VENTRICLE PLAX 2D LVIDd:         4.05 cm  Diastology LVIDs:         3.01 cm  LV e' medial:    3.08 cm/s LV PW:         1.12 cm  LV E/e' medial:  33.4 LV IVS:        1.04 cm  LV e' lateral:   6.38 cm/s LVOT diam:     2.00 cm  LV E/e' lateral: 16.1 LV SV:         43 LV SV Index:   29 LVOT Area:     3.14 cm  RIGHT VENTRICLE RV S prime:     12.60 cm/s TAPSE (M-mode): 1.7 cm LEFT ATRIUM             Index       RIGHT ATRIUM           Index LA diam:         3.20 cm 2.20 cm/m  RA Area:     11.20 cm LA Vol (A2C):   44.5 ml 30.54 ml/m RA Volume:   26.30 ml  18.05 ml/m LA Vol (A4C):   63.4 ml 43.51 ml/m LA Biplane Vol: 58.4 ml 40.08 ml/m  AORTIC VALVE AV Area (Vmax):    0.48 cm AV Area (Vmean):   0.46 cm AV Area (VTI):     0.55 cm AV Vmax:           339.33 cm/s AV Vmean:          236.000 cm/s AV VTI:            0.770 m AV Peak Grad:      46.1 mmHg AV Mean Grad:      26.0 mmHg LVOT Vmax:         51.70 cm/s LVOT Vmean:        34.800 cm/s LVOT VTI:          0.136 m LVOT/AV VTI ratio: 0.18  AORTA Ao Root diam: 2.50 cm MITRAL VALVE                TRICUSPID VALVE MV Area (PHT): 4.41 cm     TR Peak grad:   49.3 mmHg MV Decel Time: 172 msec     TR Vmax:        351.00 cm/s MR Peak grad: 92.9 mmHg MR Mean grad: 63.0 mmHg     SHUNTS MR Vmax:      482.00 cm/s   Systemic VTI:  0.14 m MR Vmean:     376.0 cm/s    Systemic Diam: 2.00 cm MV E velocity: 103.00 cm/s MV A velocity: 48.00 cm/s MV E/A ratio:  2.15 Rozann Lesches MD Electronically signed by Rozann Lesches MD Signature Date/Time: 10/15/2020/4:37:58 PM    Final     Duration of Death Summary Encounter   Greater than 30 minutes including physician time.  Signed, Cadence Ninfa Meeker, PA-C 2020/10/30, 10:36 AM  Agree with Cadence Kathlen Mody, PA-C as detailed above. Patient passed  away early this morning in the setting of severe AS and other significant comorbid conditions. He had an overall poor prognosis and was being planned for hospice. Family made aware overnight.  Gwyndolyn Kaufman, MD

## 2020-11-16 NOTE — Progress Notes (Signed)
Family called they are not coming. Notified Dr. Samul Dada.

## 2020-11-16 DEATH — deceased

## 2021-12-03 IMAGING — DX DG CHEST 1V PORT
1 series · 1 of 1 positions shown · non-contrast
Comparison: 10/06/2020

CLINICAL DATA: Chest pain

EXAM:
PORTABLE CHEST 1 VIEW

[chest ap]
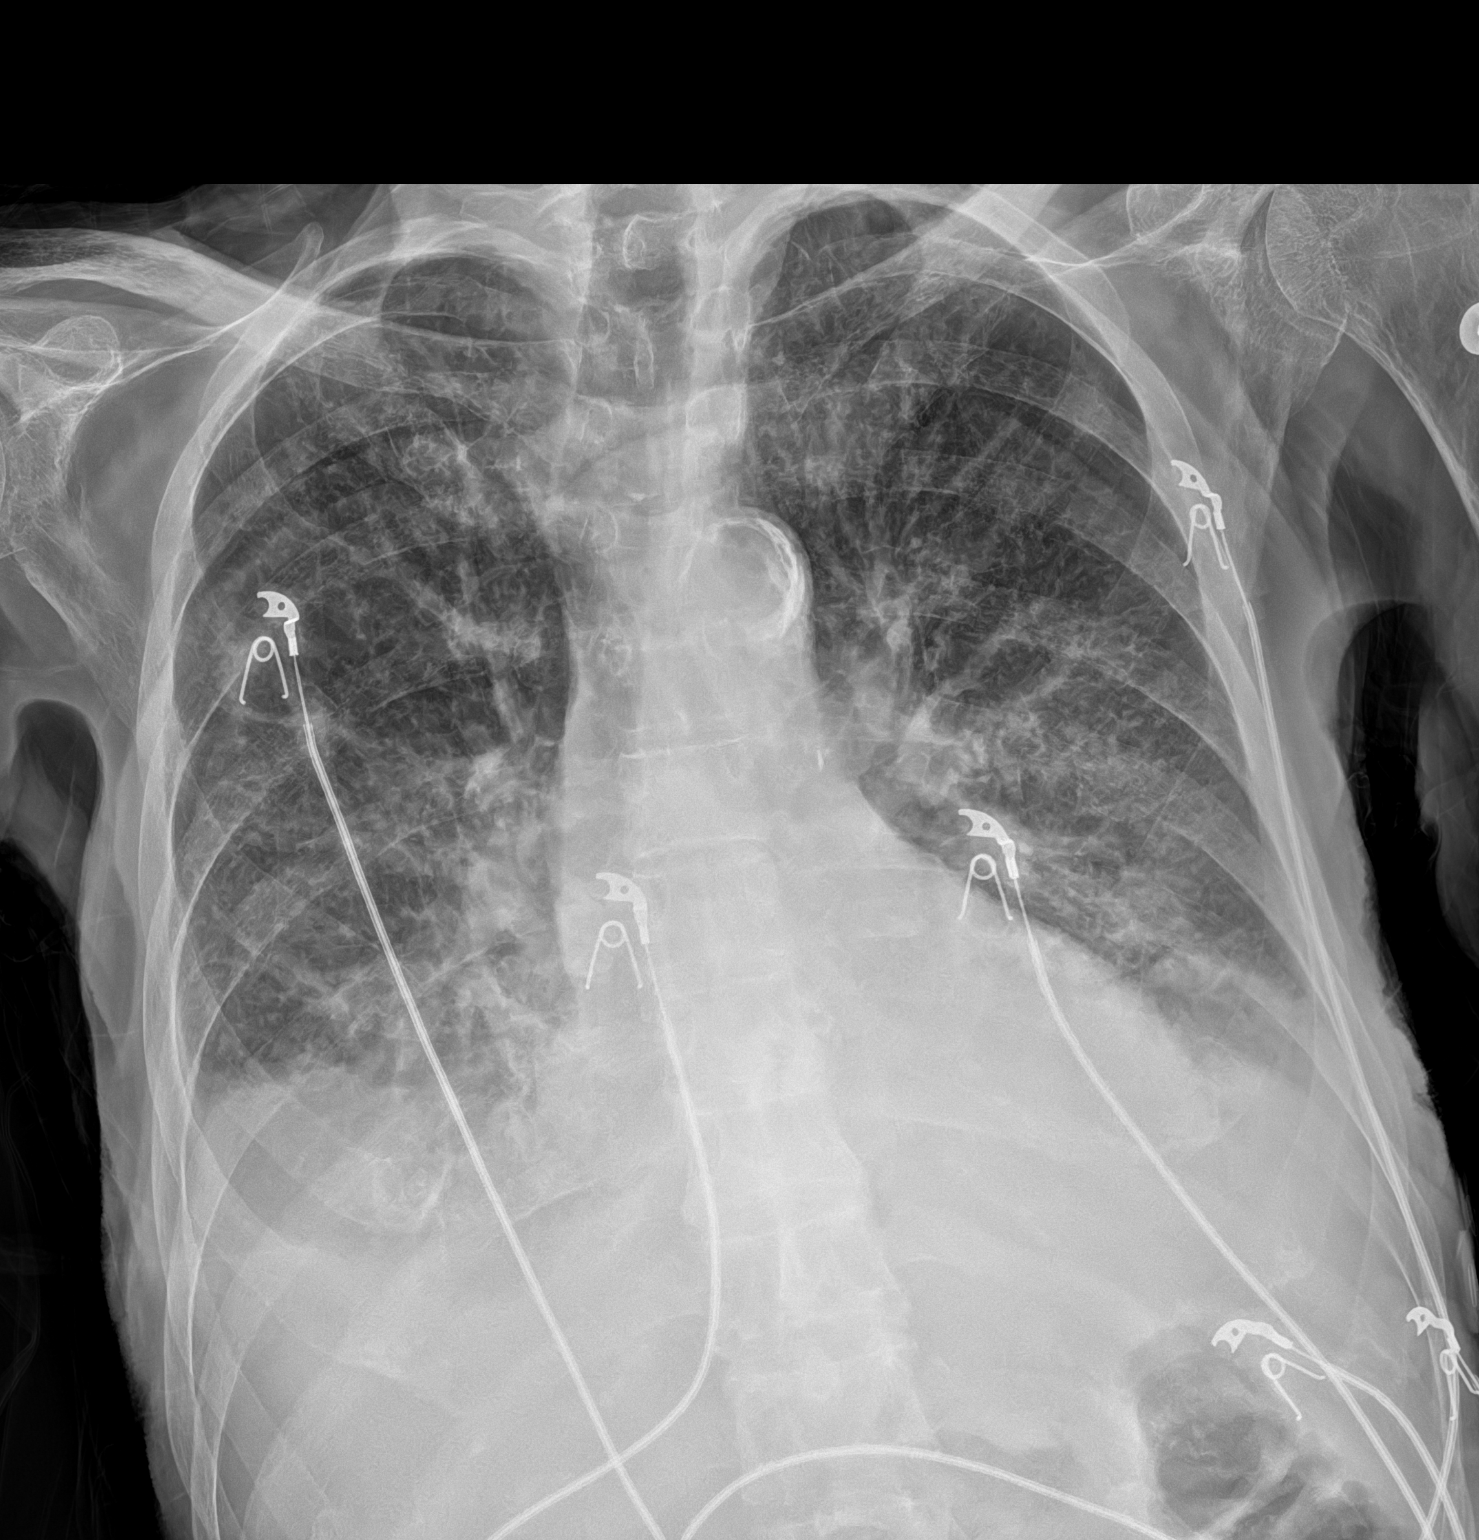

[1 of 1 positions shown; findings below may reference images not displayed]

FINDINGS: Cardiac shadow is stable. Aortic calcifications are again noted.
Lungs are well aerated bilaterally. Small effusions are seen
bilaterally. Mild interstitial changes are noted consistent with
edema slightly improved from the prior exam. No bony abnormality is
noted.
IMPRESSION: Mild interstitial edema slightly improved from the prior exam.
